# Patient Record
Sex: Male | Born: 1955 | Race: White | Hispanic: No | Smoking: Former smoker
Health system: Southern US, Community
[De-identification: ages and names within clinical notes are randomized; demographics above are authoritative.]

## PROBLEM LIST (undated history)

## (undated) DIAGNOSIS — I739 Peripheral vascular disease, unspecified: Secondary | ICD-10-CM

## (undated) DIAGNOSIS — E785 Hyperlipidemia, unspecified: Secondary | ICD-10-CM

## (undated) DIAGNOSIS — I10 Essential (primary) hypertension: Secondary | ICD-10-CM

## (undated) DIAGNOSIS — I2510 Atherosclerotic heart disease of native coronary artery without angina pectoris: Secondary | ICD-10-CM

## (undated) DIAGNOSIS — E119 Type 2 diabetes mellitus without complications: Secondary | ICD-10-CM

## (undated) DIAGNOSIS — I071 Rheumatic tricuspid insufficiency: Secondary | ICD-10-CM

## (undated) DIAGNOSIS — I639 Cerebral infarction, unspecified: Secondary | ICD-10-CM

## (undated) DIAGNOSIS — E876 Hypokalemia: Secondary | ICD-10-CM

## (undated) DIAGNOSIS — N1831 Chronic kidney disease, stage 3a: Secondary | ICD-10-CM

## (undated) HISTORY — PX: CARDIAC SURGERY: SHX584

## (undated) HISTORY — DX: Hypokalemia: E87.6

## (undated) HISTORY — DX: Rheumatic tricuspid insufficiency: I07.1

## (undated) HISTORY — DX: Cerebral infarction, unspecified: I63.9

## (undated) HISTORY — DX: Peripheral vascular disease, unspecified: I73.9

## (undated) HISTORY — DX: Chronic kidney disease, stage 3a: N18.31

---

## 2005-07-05 ENCOUNTER — Ambulatory Visit (HOSPITAL_COMMUNITY): Admission: RE | Admit: 2005-07-05 | Discharge: 2005-07-05 | Payer: Self-pay | Admitting: Orthopaedic Surgery

## 2006-05-11 ENCOUNTER — Emergency Department (HOSPITAL_COMMUNITY): Admission: EM | Admit: 2006-05-11 | Discharge: 2006-05-11 | Payer: Self-pay | Admitting: Emergency Medicine

## 2018-11-25 ENCOUNTER — Other Ambulatory Visit: Payer: Self-pay

## 2018-11-25 ENCOUNTER — Encounter (HOSPITAL_COMMUNITY): Payer: Self-pay

## 2018-11-25 ENCOUNTER — Emergency Department (HOSPITAL_COMMUNITY)
Admission: EM | Admit: 2018-11-25 | Discharge: 2018-11-25 | Disposition: A | Discharge status: Home / Self Care | Payer: Medicare Other | Attending: Emergency Medicine | Admitting: Emergency Medicine

## 2018-11-25 DIAGNOSIS — Z87891 Personal history of nicotine dependence: Secondary | ICD-10-CM | POA: Diagnosis not present

## 2018-11-25 DIAGNOSIS — Z7984 Long term (current) use of oral hypoglycemic drugs: Secondary | ICD-10-CM | POA: Insufficient documentation

## 2018-11-25 DIAGNOSIS — Z79899 Other long term (current) drug therapy: Secondary | ICD-10-CM | POA: Diagnosis not present

## 2018-11-25 DIAGNOSIS — Z7902 Long term (current) use of antithrombotics/antiplatelets: Secondary | ICD-10-CM | POA: Diagnosis not present

## 2018-11-25 DIAGNOSIS — I10 Essential (primary) hypertension: Secondary | ICD-10-CM | POA: Diagnosis not present

## 2018-11-25 DIAGNOSIS — E119 Type 2 diabetes mellitus without complications: Secondary | ICD-10-CM | POA: Insufficient documentation

## 2018-11-25 DIAGNOSIS — R42 Dizziness and giddiness: Secondary | ICD-10-CM | POA: Diagnosis not present

## 2018-11-25 HISTORY — DX: Atherosclerotic heart disease of native coronary artery without angina pectoris: I25.10

## 2018-11-25 HISTORY — DX: Hyperlipidemia, unspecified: E78.5

## 2018-11-25 HISTORY — DX: Essential (primary) hypertension: I10

## 2018-11-25 HISTORY — DX: Type 2 diabetes mellitus without complications: E11.9

## 2018-11-25 LAB — COMPREHENSIVE METABOLIC PANEL
ALT: 26 U/L (ref 0–44)
AST: 17 U/L (ref 15–41)
Albumin: 3.8 g/dL (ref 3.5–5.0)
Alkaline Phosphatase: 54 U/L (ref 38–126)
Anion gap: 9 (ref 5–15)
BUN: 19 mg/dL (ref 8–23)
CO2: 27 mmol/L (ref 22–32)
Calcium: 8.9 mg/dL (ref 8.9–10.3)
Chloride: 104 mmol/L (ref 98–111)
Creatinine, Ser: 1.36 mg/dL — ABNORMAL HIGH (ref 0.61–1.24)
GFR calc Af Amer: >60 mL/min (ref >60)
GFR calc non Af Amer: 55 mL/min — ABNORMAL LOW (ref >60)
Glucose, Bld: 138 mg/dL — ABNORMAL HIGH (ref 70–99)
Potassium: 3.8 mmol/L (ref 3.5–5.1)
Sodium: 140 mmol/L (ref 135–145)
Total Bilirubin: 0.7 mg/dL (ref 0.3–1.2)
Total Protein: 6.9 g/dL (ref 6.5–8.1)

## 2018-11-25 LAB — CBC WITH DIFFERENTIAL/PLATELET
Abs Immature Granulocytes: 0.05 10*3/uL (ref 0.00–0.07)
Basophils Absolute: 0.1 10*3/uL (ref 0.0–0.1)
Basophils Relative: 1 %
Eosinophils Absolute: 0.2 10*3/uL (ref 0.0–0.5)
Eosinophils Relative: 2 %
HCT: 45.4 % (ref 39.0–52.0)
Hemoglobin: 15 g/dL (ref 13.0–17.0)
Immature Granulocytes: 1 %
Lymphocytes Relative: 18 %
Lymphs Abs: 1.7 10*3/uL (ref 0.7–4.0)
MCH: 30.7 pg (ref 26.0–34.0)
MCHC: 33 g/dL (ref 30.0–36.0)
MCV: 92.8 fL (ref 80.0–100.0)
Monocytes Absolute: 0.6 10*3/uL (ref 0.1–1.0)
Monocytes Relative: 6 %
Neutro Abs: 7 10*3/uL (ref 1.7–7.7)
Neutrophils Relative %: 72 %
Platelets: 197 10*3/uL (ref 150–400)
RBC: 4.89 MIL/uL (ref 4.22–5.81)
RDW: 13.6 % (ref 11.5–15.5)
WBC: 9.6 10*3/uL (ref 4.0–10.5)
nRBC: 0 % (ref 0.0–0.2)

## 2018-11-25 MED ORDER — HYDRALAZINE HCL 10 MG PO TABS
10.0000 mg | ORAL_TABLET | Freq: Once | ORAL | Status: AC
  Administered 2018-11-25: 10 mg via ORAL
  Filled 2018-11-25: qty 1

## 2018-11-25 MED ORDER — MECLIZINE HCL 12.5 MG PO TABS
25.0000 mg | ORAL_TABLET | Freq: Once | ORAL | Status: AC
  Administered 2018-11-25: 18:00:00 25 mg via ORAL
  Filled 2018-11-25: qty 2

## 2018-11-25 MED ORDER — AMLODIPINE BESYLATE 5 MG PO TABS: 5.0000 mg | ORAL_TABLET | Freq: Every day | ORAL | 0 refills | Status: DC

## 2018-11-25 MED ORDER — MECLIZINE HCL 25 MG PO TABS: 25.0000 mg | ORAL_TABLET | Freq: Three times a day (TID) | ORAL | 0 refills | Status: DC | PRN

## 2018-11-25 NOTE — Discharge Instructions (Signed)
Starting tomorrow, take the additional blood pressure medication as prescribed.  Call your primary care provider tomorrow for close follow-up on your visit today. Limit your salt intake in your diet, as this contribute to high blood pressure. You can take meclizine every 8 hours as needed for dizziness. If you develop headache, vision changes, chest pain, or new or worsening symptoms, please return to the emergency department.

## 2018-11-25 NOTE — ED Triage Notes (Signed)
Pt reports he has been checking his at home for the past the days it has been 201/106. Pt reports he has vertigo and gets dizzy when he looks down becomes dizzy. Pt reports doubling up metoprolol . Reports BP has been up for 6 days

## 2018-11-25 NOTE — ED Provider Notes (Signed)
[facility]Y [facility]PARTMENT Provider Note   CSN: [id number] Arrival date & time: 11/25/18  1550    History   Chief Complaint Chief Complaint  Patient presents with  . Hypertension    HPI Kyle Marks is a 63 y.o. male history of hypertension, type 2 diabetes, CAD, hyperlipidemia, presenting to the emergency department with complaint of 1 week of intermittent room-spinning dizziness.  He states his dizziness is positional, worse when he looks down, gets up too quickly, or turns his head to the side too quickly.  He feels better when he is at rest.  The dizziness does cause some nausea.  This is been causing a decreased appetite.  He states he did not have a functioning blood pressure cuff, however got a new one a few days ago and noted his blood pressure was elevated, in the 200s systolic.  He states he began taking double of his metoprolol, however he is unsure of the dosing.  He denies associated headache, vision changes, chest pain, shortness of breath, abdominal pain, neck pain, or any other complaints. 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     The history is provided by the patient.    Past Medical History:  Diagnosis Date  . Coronary artery disease    pt reports he has stents  . Diabetes mellitus without complication (HCC)   . Hyperlipidemia   . Hypertension     There are no active problems to display for this patient.   Past Surgical History:  Procedure Laterality Date  . CARDIAC SURGERY          Home Medications    Prior to Admission medications   Medication Sig Start Date End Date Taking? Authorizing Provider  atorvastatin (LIPITOR) 40 MG tablet Take 40 mg by mouth every evening. 10/21/18  Yes [provider]  clopidogrel (PLAVIX) 75 MG tablet Take 75 mg by mouth at bedtime.  10/21/18  Yes [provider]  glimepiride (AMARYL) 4 MG tablet Take 4 mg by mouth  at bedtime.  10/21/18  Yes [provider]  metFORMIN (GLUCOPHAGE-XR) 500 MG 24 hr tablet Take 1,000 mg by mouth 2 (two) times daily. 10/23/18  Yes [provider]  metoprolol tartrate (LOPRESSOR) 50 MG tablet Take 50 mg by mouth 2 (two) times daily. 10/21/18  Yes [provider]  amLODipine (NORVASC) 5 MG tablet Take 1 tablet (5 mg total) by mouth daily. 11/25/18   Zarria Towell, [place] N, PA-C  meclizine (ANTIVERT) 25 MG tablet Take 1 tablet (25 mg total) by mouth 3 (three) times daily as needed for dizziness. 11/25/18   Kayleen Alig, [place] N, PA-C    Family History No family history on file.  Social History Social History   Tobacco Use  . Smoking status: Former Smoker    Quit date: 11/24/2017    Years since quitting: 1.0  Substance Use Topics  . Alcohol use: Not Currently  . Drug use: Never     Allergies   Patient has no known allergies.   Review of Systems Review of Systems  Eyes: Negative for photophobia and visual disturbance.  Respiratory: Negative for shortness of breath.   Cardiovascular: Negative for chest pain and leg swelling.  Gastrointestinal: Positive for nausea.  Musculoskeletal: Negative for back pain and neck pain.  Neurological: Positive for dizziness. Negative for syncope, speech difficulty, weakness, light-headedness, numbness and headaches.  All other systems reviewed and  are negative.    Physical Exam Updated Vital Signs BP (!) 201/94 (BP Location: Right Arm)   Pulse 60   Temp 98.2 F (36.8 C) (Oral)   Resp 18   Ht 5\' 11"  (1.803 m)   Wt 113.4 kg   SpO2 97%   BMI 34.87 kg/m   Physical Exam Vitals signs and nursing note reviewed.  Constitutional:      General: He is not in acute distress.    Appearance: He is well-developed. He is obese. He is not ill-appearing.  HENT:     Head: Normocephalic and atraumatic.  Eyes:     Conjunctiva/sclera: Conjunctivae normal.  Cardiovascular:     Rate and Rhythm: Normal rate and regular  rhythm.  Pulmonary:     Effort: Pulmonary effort is normal. No respiratory distress.     Breath sounds: Normal breath sounds.  Abdominal:     General: Bowel sounds are normal.     Palpations: Abdomen is soft.     Tenderness: There is no abdominal tenderness. There is no guarding or rebound.  Musculoskeletal:     Right lower leg: No edema.     Left lower leg: No edema.  Skin:    General: Skin is warm.  Neurological:     Mental Status: He is alert.     Comments: Mental Status:  Alert, oriented, thought content appropriate, able to give a coherent history. Speech fluent without evidence of aphasia. Able to follow 2 step commands without difficulty.  Cranial Nerves:  II:  Peripheral visual fields grossly normal, pupils equal, round, reactive to light III,IV, VI: ptosis not present, extra-ocular motions intact bilaterally  V,VII: smile symmetric, facial light touch sensation equal VIII: hearing grossly normal to voice  X: uvula elevates symmetrically  XI: bilateral shoulder shrug symmetric and strong XII: midline tongue extension without fassiculations Motor:  Normal tone. 5/5 in upper and lower extremities bilaterally including strong and equal grip strength and dorsiflexion/plantar flexion Sensory: grossly normal in all extremities.  Cerebellar: normal finger-to-nose with bilateral upper extremities. No pronator drift. Normal heel-to-shin BLE Gait: normal gait and balance CV: distal pulses palpable throughout    Psychiatric:        Mood and Affect: Mood normal.        Behavior: Behavior normal.      ED Treatments / Results  Labs (all labs ordered are listed, but only abnormal results are displayed) Labs Reviewed  COMPREHENSIVE METABOLIC PANEL - Abnormal; Notable for the following components:      Result Value   Glucose, Bld 138 (*)    Creatinine, Ser 1.36 (*)    GFR calc non Af Amer 55 (*)    All other components within normal limits  CBC WITH DIFFERENTIAL/PLATELET     EKG None  Radiology No results found.  Procedures Procedures (including critical care time)  Medications Ordered in ED Medications  hydrALAZINE (APRESOLINE) tablet 10 mg (10 mg Oral Given 11/25/18 1739)  meclizine (ANTIVERT) tablet 25 mg (25 mg Oral Given 11/25/18 1739)     Initial Impression / Assessment and Plan / ED Course  I have reviewed the triage vital signs and the nursing notes.  Pertinent labs & imaging results that were available during my care of the patient were reviewed by me and considered in my medical decision making (see chart for details).        Pt presenting with hypertension and symptoms consistent with prior episodes of peripheral vertigo. Symptoms of dizziness are positional and  improved with rest. He reports symptoms are just like prior episodes of vertigo. No headache, vision changes, or other assoc symptoms. He takes an unknown dose of metoprolol daily, states his BP normally runs around 160 systolic. Normal neuro exam today. Pt discussed with and evaluated by Dr. Juleen ChinaKohut, who recommends treat BP and vertigo, does not believe stroke work up is indicated due to presentation of symptoms and similar hx of vertigo. Pt agreeable with this plan. Treated with meclizine and hydralazine. Will monitor and re-evaluate.  Patient reports significant improvement in symptoms after meclizine.  Blood pressure with slight improvement after hydralazine, though still remains elevated.  This was discussed with Dr. Juleen ChinaKohut, who recommends patient is safe for discharge.  He has close outpatient follow-up, instructed he call tomorrow.  Will start him on low-dose Norvasc to take in addition to metoprolol for better blood pressure management.  He is instructed to return to the ED should any symptoms change or worsen in any way.  He seems reliable for follow-up.  Patient is agreeable to plan and safe for discharge.  Discussed results, findings, treatment and follow up. Patient advised of  return precautions. Patient verbalized understanding and agreed with plan.  Final Clinical Impressions(s) / ED Diagnoses   Final diagnoses:  Hypertension, unspecified type  Vertigo    ED Discharge Orders         Ordered    amLODipine (NORVASC) 5 MG tablet  Daily     11/25/18 1937    meclizine (ANTIVERT) 25 MG tablet  3 times daily PRN     11/25/18 1939           Jacqueline Delapena, [place] N, PA-C 11/25/18 2122    Raeford RazorKohut, Stephen, MD 11/26/18 [id number]

## 2018-11-25 NOTE — ED Notes (Signed)
Pt reports he smoked to over 40 years   Stopped last year

## 2018-12-18 ENCOUNTER — Encounter

## 2018-12-18 ENCOUNTER — Ambulatory Visit (INDEPENDENT_AMBULATORY_CARE_PROVIDER_SITE_OTHER): Payer: Medicare Other | Admitting: Internal Medicine

## 2018-12-18 ENCOUNTER — Other Ambulatory Visit: Payer: Self-pay

## 2018-12-18 VITALS — BP 157/82 | HR 89 | Temp 95.9°F | Ht 71.0 in | Wt 257.2 lb

## 2018-12-18 DIAGNOSIS — R634 Abnormal weight loss: Secondary | ICD-10-CM

## 2018-12-18 MED ORDER — ATORVASTATIN CALCIUM 80 MG PO TABS: 80.0000 mg | ORAL_TABLET | Freq: Every day | ORAL | 3 refills | Status: DC

## 2018-12-18 NOTE — Progress Notes (Signed)
Cardiology Office Note   Date:  12/18/2018   ID:  Kyle Marks, DOB 08-28-1955, MRN [id number]  PCP:  Doristine Section, MD  Cardiologist:   Dorris Carnes, MD   F/U of HTN    History of Present Illness: Kyle Marks is a 63 y.o. male with a history of HTN, DM, PVOD (s/p iliac stent), HL    He was seen at [facility] ED on 11/25/18 with dizziness.   Pt reports positional, worse with looking down, turnnig head too fast, change in position.   Assoc  With nausea  BP has been high at that time  In ED symptoms improved with meclizine  Again, BP was noted to be very high  He was given Rx for  amlodipine and told to continue metoprolol  [facility]ce ED, he has not started amlodipine   He continues on metoprolol Dizziness is improved  Denies CP     Review of records pt reported on losartan  He admits to missing    Current Meds  Medication Sig  . atorvastatin (LIPITOR) 40 MG tablet Take 40 mg by mouth every evening.  . clopidogrel (PLAVIX) 75 MG tablet Take 75 mg by mouth at bedtime.   Marland Kitchen glimepiride (AMARYL) 4 MG tablet Take 4 mg by mouth at bedtime.   . metFORMIN (GLUCOPHAGE-XR) 500 MG 24 hr tablet Take 1,000 mg by mouth 2 (two) times daily.  . metoprolol tartrate (LOPRESSOR) 50 MG tablet Take 50 mg by mouth 2 (two) times daily.     Allergies:   Patient has no known allergies.   Past Medical History:  Diagnosis Date  . Coronary artery disease    pt reports he has stents  . Diabetes mellitus without complication ([place])   . Hyperlipidemia   . Hypertension     Past Surgical History:  Procedure Laterality Date  . CARDIAC SURGERY       Social History:  The patient  reports that he quit smoking about 12 months ago. He does not have any smokeless tobacco history on file. He reports previous alcohol use. He reports that he does not use drugs.   Family History:  The patient's family history is not on file.    ROS:  Please see the history of present illness. All other systems are  reviewed and  Negative to the above problem except as noted.    PHYSICAL EXAM: VS:  BP (!) 157/82   Pulse 89   Temp (!) 95.9 F (35.5 C) (Temporal)   Ht 5\' 11"  (1.803 m)   Wt 257 lb 3.2 oz (116.7 kg)   SpO2 98%   BMI 35.87 kg/m   GEN: Obese 63 yo  in no acute distress  HEENT: normal  Neck: no JVD, carotid bruits, or masses Cardiac: RRR; no murmurs, rubs, or gallops,no edema  Respiratory:  clear to auscultation bilaterally, normal work of breathing GI: soft, nontender, nondistended, + BS  No hepatomegaly  MS: no deformity Moving all extremities   Skin: warm and dry, no rash Neuro:  Strength and sensation are intact Psych: euthymic mood, full affect   EKG:  EKG is not ordered today.  On 11/25/18:  SR 64 bpm   Nonspecific ST changes    Lipid Panel No results found for: CHOL, TRIG, HDL, CHOLHDL, VLDL, LDLCALC, LDLDIRECT    Wt Readings from Last 3 Encounters:  12/18/18 257 lb 3.2 oz (116.7 kg)  11/25/18 250 lb (113.4 kg)      ASSESSMENT AND  PLAN:  1  HTN  BP is better than in ED   Still not optimized    HE is followed in [facility] system  Seen on 9/3 by Dr Annabelle HarmanSmith   Looks like he had been on Hyzaar but not taking   Neeed to confirm what he has   By review of last not meds rewritten for.    2  Dizziness  Improved      3  HL   On lipitor reportedly  Labs on 12/17/18:   LDL 93  HDL 30   Should be lower.   Compliance may be an issue  4  Hx PVOD   S/p iliac staent  On plavix  5  Hx DM  REcords show A1C is up to 10   Last was 7      Stressed importance of taking meds     Given that he is established with a provider through the [facility] system who is addressing above problems, I would not recomm f/u as initially planned     Signed, Dietrich PatesPaula Geovanie Winnett, MD  12/18/2018 1:52 PM    Kyle Marks [address], [place], [place]  [address] Phone: [phone]; Fax: [phone]

## 2018-12-18 NOTE — Patient Instructions (Signed)
Medication Instructions:  Your physician recommends that you continue on your current medications as directed. Please refer to the Current Medication list given to you today.  If you need a refill on your cardiac medications before your next appointment, please call your pharmacy.   Lab work: NONE   If you have labs (blood work) drawn today and your tests are completely normal, you will receive your results only by: . MyChart Message (if you have MyChart) OR . A paper copy in the mail If you have any lab test that is abnormal or we need to change your treatment, we will call you to review the results.  Testing/Procedures: NONE   Follow-Up: At [facility], you and your health needs are our priority.  As part of our continuing mission to provide you with exceptional heart care, we have created designated Provider Care Teams.  These Care Teams include your primary Cardiologist (physician) and Advanced Practice Providers (APPs -  Physician Assistants and Nurse Practitioners) who all work together to provide you with the care you need, when you need it. You will need a follow up appointment in 6 months.  Please call our office 2 months in advance to schedule this appointment.  You may see No primary care provider on file. or one of the following Advanced Practice Providers on your designated Care Team:   Brittany Strader, PA-C ([place] Office) . Michele Lenze, PA-C ([place] Office)  Any Other Special Instructions Will Be Listed Below (If Applicable). Thank you for choosing [facility]!     

## 2019-01-20 ENCOUNTER — Encounter: Payer: Medicare Other | Attending: Internal Medicine | Admitting: Nutrition

## 2019-01-20 VITALS — Ht 71.0 in | Wt 259.0 lb

## 2019-01-20 DIAGNOSIS — IMO0002 Reserved for concepts with insufficient information to code with codable children: Secondary | ICD-10-CM

## 2019-01-20 DIAGNOSIS — E1165 Type 2 diabetes mellitus with hyperglycemia: Secondary | ICD-10-CM | POA: Insufficient documentation

## 2019-01-20 DIAGNOSIS — E118 Type 2 diabetes mellitus with unspecified complications: Secondary | ICD-10-CM | POA: Insufficient documentation

## 2019-01-20 NOTE — Progress Notes (Signed)
Telephone visit. Medical Nutrition Therapy:  Appt start time: 5638 end time:  7564.  Assessment:  Primary concerns today:  Diabetes x 5 yrs. LIves with his wife. Wife does most of the shopping and cooking. Eats 2 meals and snacks.Marland Kitchen Referred by his cardiologist. He wants to lose weight. Metformin XR 500 mg BID, Glimepiride 4 mg a day Thinks last A1C was about  7.1 -7.2% He admits to being a snacker. Noted elevated Creatine level of 1.36 mg/dl. CMP Latest Ref Rng & Units 11/25/2018  Glucose 70 - 99 mg/dL 138(H)  BUN 8 - 23 mg/dL 19  Creatinine 0.61 - 1.24 mg/dL 1.36(H)  Sodium 135 - 145 mmol/L 140  Potassium 3.5 - 5.1 mmol/L 3.8  Chloride 98 - 111 mmol/L 104  CO2 22 - 32 mmol/L 27  Calcium 8.9 - 10.3 mg/dL 8.9  Total Protein 6.5 - 8.1 g/dL 6.9  Total Bilirubin 0.3 - 1.2 mg/dL 0.7  Alkaline Phos 38 - 126 U/L 54  AST 15 - 41 U/L 17  ALT 0 - 44 U/L 26   Lipid Panel     Preferred Learning Style:     No preference indicated   Learning Readiness:   Ready  Change in progress   MEDICATIONS:   DIETARY INTAKE:  24-hr recall:  B ( AM): Slim fast  Snk ( AM):   L ( PM): [place]  beef and potatoes, water  Snk ( PM): D ( PM):  6 chicken finger, FF and [place] toast.water Snk ( PM): Beverages:  Usual physical activity:  Walks some  Estimated energy needs: 1600  calories 180 g carbohydrates 120 g protein 44 g fat  Progress Towards Goal(s):  In progress.   Nutritional Diagnosis:  NB-1.1 Food and nutrition-related knowledge deficit As related to diabetes and Obesity  As evidenced by BMI 46 and A1C 7.1%.    Intervention:  Nutrition and Diabetes education provided on My Plate, CHO counting, meal planning, portion sizes, timing of meals, avoiding snacks between meals unless having a low blood sugar, target ranges for A1C and blood sugars, signs/symptoms and treatment of hyper/hypoglycemia, monitoring blood sugars, taking medications as prescribed, benefits of exercising 30  minutes per day and prevention of complications of DM.  Goals  Follow My Plate  Eat three meals per day Cut out processed foods and foods high in salt. Cut out snacks Walk 15 minutes BID. Get FBS less than 130 mg/dl. Lose 1 lb per week Get A1C to 6.5% or less.  Teaching Method Utilized:  Visual Auditory Hands on  Handouts given during visit include:  The Plate Method   Meal Plan  Diabetes Instructions     Barriers to learning/adherence to lifestyle change: none  Demonstrated degree of understanding via:  Teach Back   Monitoring/Evaluation:  Dietary intake, exercise, , and body weight in 1 month(s).

## 2019-01-21 ENCOUNTER — Encounter: Payer: Self-pay | Admitting: Nutrition

## 2019-01-21 NOTE — Patient Instructions (Signed)
  Goals  Follow My Plate  Eat three meals per day Cut out processed foods and foods high in salt. Cut out snacks Walk 15 minutes BID. Get FBS less than 130 mg/dl. Lose 1 lb per week Get A1C to 6.5% or less

## 2019-02-15 ENCOUNTER — Other Ambulatory Visit: Payer: Self-pay

## 2019-02-24 ENCOUNTER — Encounter: Payer: Self-pay | Admitting: Nutrition

## 2019-02-24 ENCOUNTER — Other Ambulatory Visit: Payer: Self-pay

## 2019-02-24 ENCOUNTER — Encounter: Payer: Medicare Other | Attending: Family Medicine | Admitting: Nutrition

## 2019-02-24 DIAGNOSIS — E118 Type 2 diabetes mellitus with unspecified complications: Secondary | ICD-10-CM | POA: Diagnosis present

## 2019-02-24 DIAGNOSIS — E1165 Type 2 diabetes mellitus with hyperglycemia: Secondary | ICD-10-CM | POA: Diagnosis present

## 2019-02-24 DIAGNOSIS — IMO0002 Reserved for concepts with insufficient information to code with codable children: Secondary | ICD-10-CM

## 2019-02-24 NOTE — Patient Instructions (Signed)
Goals  Follow My Plate  Eat three meals per day Increase lower carb vegetables at night. Continue to cut out processed foods and foods high in salt Cut out snacks unless veggies. Walk 30-45  minutes BID. Get FBS less than 130 mg/dl. Lose 1 lb per week-5 lbs per month. Get A1C to 6.5% or less. Drink more water

## 2019-02-24 NOTE — Progress Notes (Signed)
Telephone visit. Medical Nutrition Therapy:  Appt start time: 3151 end time:  1115  Assessment:  Primary concerns today:  Diabetes x 5 yrs. LIves with his wife. Wife does most of the shopping and cooking."I need some meal plans to follow." He notes he is trying to cut out snacking. Has been walking a little but knows he needs to do more. Hasn'Metformin XR 500 mg BID, Glimepiride 4 mg a dayt been testing blood sugars. No weight loss. Admits to eating later at night. Likes to snack late at night.  HIs wife works second shift and he snacks while home alone. A1C -6.9% 12/2018  Noted elevated Creatine level of 1.36 mg/dl.  CMP Latest Ref Rng & Units 11/25/2018  Glucose 70 - 99 mg/dL 138(H)  BUN 8 - 23 mg/dL 19  Creatinine 0.61 - 1.24 mg/dL 1.36(H)  Sodium 135 - 145 mmol/L 140  Potassium 3.5 - 5.1 mmol/L 3.8  Chloride 98 - 111 mmol/L 104  CO2 22 - 32 mmol/L 27  Calcium 8.9 - 10.3 mg/dL 8.9  Total Protein 6.5 - 8.1 g/dL 6.9  Total Bilirubin 0.3 - 1.2 mg/dL 0.7  Alkaline Phos 38 - 126 U/L 54  AST 15 - 41 U/L 17  ALT 0 - 44 U/L 26   Lipid Panel     Preferred Learning Style:     No preference indicated   Learning Readiness:   Ready  Change in progress   MEDICATIONS:   DIETARY INTAKE:  24-hr recall:  B ( AM): Slim fast  Snk ( AM):   L ( PM): [place]  beef and potatoes, water  Snk ( PM): D ( PM):  6 chicken finger, FF and [place] toast.water Snk ( PM): Beverages:  Usual physical activity:  Walks some  Estimated energy needs: 1600  calories 180 g carbohydrates 120 g protein 44 g fat  Progress Towards Goal(s):  In progress.   Nutritional Diagnosis:  NB-1.1 Food and nutrition-related knowledge deficit As related to diabetes and Obesity  As evidenced by BMI 46 and A1C 7.1%.    Intervention:  Nutrition and Diabetes education provided on My Plate, CHO counting, meal planning, portion sizes, timing of meals, avoiding snacks between meals unless having a low blood sugar,  target ranges for A1C and blood sugars, signs/symptoms and treatment of hyper/hypoglycemia, monitoring blood sugars, taking medications as prescribed, benefits of exercising 30 minutes per day and prevention of complications of DM.  Goals Test blood sugars twice a day  Follow My Plate  Eat three meals per day Increase lower carb vegetables at night. Cut out processed foods and foods high in salt.-making progress. Cut out snacks unless veggies. Walk 30-45  minutes BID. Get FBS less than 130 mg/dl. Lose 1 lb per week-5 lbs per month. Get A1C to 6.5% or less.  Teaching Method Utilized:  Visual Auditory Hands on  Handouts given during visit include:  The Plate Method   Meal Plan  Diabetes Instructions     Barriers to learning/adherence to lifestyle change: none  Demonstrated degree of understanding via:  Teach Back   Monitoring/Evaluation:  Dietary intake, exercise, , and body weight in 3 month(s).

## 2019-04-13 ENCOUNTER — Other Ambulatory Visit: Payer: Self-pay

## 2019-04-13 ENCOUNTER — Encounter (HOSPITAL_COMMUNITY): Payer: Self-pay | Admitting: Emergency Medicine

## 2019-04-13 ENCOUNTER — Emergency Department (HOSPITAL_COMMUNITY)
Admission: EM | Admit: 2019-04-13 | Discharge: 2019-04-13 | Disposition: A | Discharge status: Home / Self Care | Payer: Medicare Other | Attending: Emergency Medicine | Admitting: Emergency Medicine

## 2019-04-13 DIAGNOSIS — E119 Type 2 diabetes mellitus without complications: Secondary | ICD-10-CM | POA: Diagnosis not present

## 2019-04-13 DIAGNOSIS — I10 Essential (primary) hypertension: Secondary | ICD-10-CM | POA: Diagnosis not present

## 2019-04-13 DIAGNOSIS — Z7984 Long term (current) use of oral hypoglycemic drugs: Secondary | ICD-10-CM | POA: Insufficient documentation

## 2019-04-13 DIAGNOSIS — Z79899 Other long term (current) drug therapy: Secondary | ICD-10-CM | POA: Diagnosis not present

## 2019-04-13 DIAGNOSIS — I2510 Atherosclerotic heart disease of native coronary artery without angina pectoris: Secondary | ICD-10-CM | POA: Diagnosis not present

## 2019-04-13 DIAGNOSIS — Z7901 Long term (current) use of anticoagulants: Secondary | ICD-10-CM | POA: Insufficient documentation

## 2019-04-13 DIAGNOSIS — Z87891 Personal history of nicotine dependence: Secondary | ICD-10-CM | POA: Insufficient documentation

## 2019-04-13 DIAGNOSIS — H538 Other visual disturbances: Secondary | ICD-10-CM | POA: Diagnosis present

## 2019-04-13 LAB — CBG MONITORING, ED: Glucose-Capillary: 295 mg/dL — ABNORMAL HIGH (ref 70–99)

## 2019-04-13 MED ORDER — LOSARTAN POTASSIUM 25 MG PO TABS
12.5000 mg | ORAL_TABLET | Freq: Once | ORAL | Status: DC
  Filled 2019-04-13: qty 1

## 2019-04-13 MED ORDER — LOSARTAN POTASSIUM-HCTZ 100-12.5 MG PO TABS: 1.0000 | ORAL_TABLET | Freq: Every day | ORAL | 0 refills | Status: DC

## 2019-04-13 MED ORDER — LOSARTAN POTASSIUM 25 MG PO TABS
25.0000 mg | ORAL_TABLET | Freq: Once | ORAL | Status: AC
  Administered 2019-04-13: 25 mg via ORAL

## 2019-04-13 NOTE — ED Notes (Signed)
Patient now states that he took his medication today but had been out for a week until today.

## 2019-04-13 NOTE — ED Notes (Signed)
Patient's blood pressure still elevated. Patient given blood pressure medication in triage.

## 2019-04-13 NOTE — ED Triage Notes (Signed)
Patient states hypertension x 1 week. Patient denies any blurred vision, weakness, or headache. Patient' current blood pressure id 187/94 in triage. Patient states that he has been out of his Losartan x 1 week. Patient is on metoprolol and has been currently taking that medication.

## 2019-04-13 NOTE — Discharge Instructions (Signed)
Please take your home blood pressure medicines, your blood pressure here has improved - and will continue to improve as you take your medicines.  ER for worsening symptoms - see the attached phone number for the cardiology office in [place].

## 2019-04-13 NOTE — ED Notes (Signed)
Pt reports he has been out of his losartan for a week, pt asks can he go home now, (after this nurse gives BP medication)- informed pt that he should wait and see the Dr, if he decided to leave it would be leaving Against Medical advice.

## 2019-04-13 NOTE — ED Provider Notes (Signed)
[facility] Provider Note   CSN: [id number] Arrival date & time: 04/13/19  1743     History Chief Complaint  Patient presents with  . Hypertension    Kyle Marks is a 63 y.o. male.  HPI   This patient is a pleasant 63 year old male with a known history of diabetes, hypertension, hyperlipidemia and coronary disease with stents.  He has been off of his blood pressure medication, has not been able to get it refilled, states that he went to the pharmacy today and was able to pick up 4 tablets but because they did not have enough they told him to come back tomorrow.  He then went to get coronavirus testing at a urgent care because he had had some coughing and diarrhea.  He tested negative but they found his blood pressure to be severely elevated and told him to come to the hospital.  He has no other symptoms including chest pain, shortness of breath, abdominal pain, weakness, numbness, tingling, headache, difficulty with walking or coordination or speech.  Everything else seems to be doing just fine and he has no significant complaints.  He was surprised to see his blood pressure as high as it was measuring over 200 systolic.  He has not yet taken any of the pills he was given at the pharmacy today Past Medical History:  Diagnosis Date  . Coronary artery disease    pt reports he has stents  . Diabetes mellitus without complication (HCC)   . Hyperlipidemia   . Hypertension     There are no problems to display for this patient.   Past Surgical History:  Procedure Laterality Date  . CARDIAC SURGERY         History reviewed. No pertinent family history.  Social History   Tobacco Use  . Smoking status: Former Smoker    Quit date: 11/24/2017    Years since quitting: 1.3  . Smokeless tobacco: Never Used  Substance Use Topics  . Alcohol use: Not Currently  . Drug use: Never    Home Medications Prior to Admission medications   Medication Sig Start  Date End Date Taking? Authorizing Provider  atorvastatin (LIPITOR) 80 MG tablet Take 1 tablet (80 mg total) by mouth daily. 12/18/18 03/18/19  Pricilla Riffleoss, Paula V, MD  clopidogrel (PLAVIX) 75 MG tablet Take 75 mg by mouth at bedtime.  10/21/18   [provider]  glimepiride (AMARYL) 4 MG tablet Take 4 mg by mouth at bedtime.  10/21/18   [provider]  losartan-hydrochlorothiazide (HYZAAR) 100-12.5 MG tablet Take 1 tablet by mouth daily.    [provider]  losartan-hydrochlorothiazide (HYZAAR) 100-12.5 MG tablet Take 1 tablet by mouth daily. 04/13/19   Eber HongMiller, Cal Gindlesperger, MD  metFORMIN (GLUCOPHAGE) 500 MG tablet Take 500 mg by mouth.    [provider]  metFORMIN (GLUCOPHAGE-XR) 500 MG 24 hr tablet Take 1,000 mg by mouth 2 (two) times daily. 10/23/18   [provider]  metoprolol tartrate (LOPRESSOR) 50 MG tablet Take 50 mg by mouth 2 (two) times daily. 10/21/18   [provider]    Allergies    Patient has no known allergies.  Review of Systems   Review of Systems  All other systems reviewed and are negative.   Physical Exam Updated Vital Signs BP (!) 191/83   Pulse 65   Temp 98.8 F (37.1 C) (Oral)   Resp 18   Ht 1.803 m (5\' 11" )   Wt 116.1 kg  SpO2 94%   BMI 35.70 kg/m   Physical Exam Vitals and nursing note reviewed.  Constitutional:      General: He is not in acute distress.    Appearance: He is well-developed.  HENT:     Head: Normocephalic and atraumatic.     Mouth/Throat:     Pharynx: No oropharyngeal exudate.  Eyes:     General: No scleral icterus.       Right eye: No discharge.        Left eye: No discharge.     Conjunctiva/sclera: Conjunctivae normal.     Pupils: Pupils are equal, round, and reactive to light.  Neck:     Thyroid: No thyromegaly.     Vascular: No JVD.  Cardiovascular:     Rate and Rhythm: Normal rate and regular rhythm.     Heart sounds: Normal heart sounds. No murmur. No friction rub. No gallop.     Pulmonary:     Effort: Pulmonary effort is normal. No respiratory distress.     Breath sounds: Normal breath sounds. No wheezing or rales.  Abdominal:     General: Bowel sounds are normal. There is no distension.     Palpations: Abdomen is soft. There is no mass.     Tenderness: There is no abdominal tenderness.  Musculoskeletal:        General: No tenderness. Normal range of motion.     Cervical back: Normal range of motion and neck supple.  Lymphadenopathy:     Cervical: No cervical adenopathy.  Skin:    General: Skin is warm and dry.     Findings: No erythema or rash.  Neurological:     Mental Status: He is alert.     Coordination: Coordination normal.     Comments: Normal speech gait and coordination, normal strength in all 4 extremities, facial symmetry present  Psychiatric:        Behavior: Behavior normal.     ED Results / Procedures / Treatments   Labs (all labs ordered are listed, but only abnormal results are displayed) Labs Reviewed  CBG MONITORING, ED - Abnormal; Notable for the following components:      Result Value   Glucose-Capillary 295 (*)    All other components within normal limits    EKG EKG Interpretation  Date/Time:  Tuesday April 13 2019 20:13:13 EST Ventricular Rate:  67 PR Interval:  154 QRS Duration: 98 QT Interval:  374 QTC Calculation: 395 R Axis:   64 Text Interpretation: Normal sinus rhythm T wave abnormality, consider lateral ischemia Abnormal ECG sine 11/2018, no changes seen Confirmed by Eber Hong ([id number]) on 04/13/2019 9:52:13 PM   Radiology No results found.  Procedures Procedures (including critical care time)  Medications Ordered in ED Medications  losartan (COZAAR) tablet 25 mg (25 mg Oral Given 04/13/19 2126)    ED Course  I have reviewed the triage vital signs and the nursing notes.  Pertinent labs & imaging results that were available during my care of the patient were reviewed by me and considered in my  medical decision making (see chart for details).    MDM Rules/Calculators/A&P                      The patient is unremarkable in his clinical appearance, his EKG is unremarkable, the patient has no significant findings of concern, his blood pressure is has improved from 210 down to 190 systolic, he has medication at home, I will give  him a written prescription in case he needs to take the prescription somewhere else if that pharmacy does not get enough.  He is agreeable to this plan.  He will also be referred to local cardiology at his request so that he can follow-up and reestablish care with a local cardiologist after his retired. Final Clinical Impression(s) / ED Diagnoses Final diagnoses:  Essential hypertension    Rx / DC Orders ED Discharge Orders         Ordered    losartan-hydrochlorothiazide (HYZAAR) 100-12.5 MG tablet  Daily     04/13/19 2214           Noemi Chapel, MD 04/13/19 2216

## 2019-04-22 ENCOUNTER — Other Ambulatory Visit: Payer: Self-pay

## 2019-04-22 ENCOUNTER — Ambulatory Visit (INDEPENDENT_AMBULATORY_CARE_PROVIDER_SITE_OTHER): Payer: Medicare Other | Admitting: Internal Medicine

## 2019-04-22 ENCOUNTER — Encounter: Payer: Self-pay | Admitting: Internal Medicine

## 2019-04-22 VITALS — BP 171/84 | HR 77 | Temp 97.7°F | Ht 71.0 in | Wt 260.0 lb

## 2019-04-22 DIAGNOSIS — E785 Hyperlipidemia, unspecified: Secondary | ICD-10-CM

## 2019-04-22 DIAGNOSIS — I499 Cardiac arrhythmia, unspecified: Secondary | ICD-10-CM | POA: Diagnosis not present

## 2019-04-22 DIAGNOSIS — I10 Essential (primary) hypertension: Secondary | ICD-10-CM | POA: Diagnosis not present

## 2019-04-22 DIAGNOSIS — E0800 Diabetes mellitus due to underlying condition with hyperosmolarity without nonketotic hyperglycemic-hyperosmolar coma (NKHHC): Secondary | ICD-10-CM

## 2019-04-22 MED ORDER — AMLODIPINE BESYLATE 5 MG PO TABS: 5.0000 mg | ORAL_TABLET | Freq: Every day | ORAL | 3 refills | Status: DC

## 2019-04-22 NOTE — Progress Notes (Signed)
Cardiology Office Note   Date:  04/22/2019   ID:  Kyle Marks, DOB 07-02-1955, MRN [id number]  PCP:  Salome Holmes, MD  Cardiologist:   Dietrich Pates, MD   F/U of HTN    History of Present Illness: Kyle Marks is a 64 y.o. male with a history of HTN, DM, PVOD (s/p iliac stent), HL    He was seen at [facility] ED on 11/25/18 with dizziness.   Pt reports positional, worse with looking down, turnnig head too fast, change in position.   Assoc with nausea.  BP has been high at that time  In ED, symptoms improved with meclizine  Again, BP was noted to be very high  He was given Rx for  amlodipine and told to continue metoprolol  SInce ED, he has not started amlodipine   He continues on metoprolol Dizziness is improved  Denies CP     Review of records pt reported on losartan  He admits to missing    Pt just seen in ED on 12/29   BP again high   Did not take meds   Takes BP at home   HIgher   One time HR was noted to be irregular  Denies palpitations  No dizziness  Trying to work on diet  No CP   No SOB   Feels pretty good   Current Meds  Medication Sig  . atorvastatin (LIPITOR) 80 MG tablet Take 1 tablet (80 mg total) by mouth daily.  . clopidogrel (PLAVIX) 75 MG tablet Take 75 mg by mouth at bedtime.   Marland Kitchen glimepiride (AMARYL) 4 MG tablet Take 4 mg by mouth at bedtime.   Marland Kitchen losartan-hydrochlorothiazide (HYZAAR) 100-12.5 MG tablet Take 1 tablet by mouth daily.  . metFORMIN (GLUCOPHAGE-XR) 500 MG 24 hr tablet Take 1,000 mg by mouth 2 (two) times daily.  . metoprolol tartrate (LOPRESSOR) 50 MG tablet Take 50 mg by mouth 2 (two) times daily.     Allergies:   Patient has no known allergies.   Past Medical History:  Diagnosis Date  . Coronary artery disease    pt reports he has stents  . Diabetes mellitus without complication (HCC)   . Hyperlipidemia   . Hypertension     Past Surgical History:  Procedure Laterality Date  . CARDIAC SURGERY       Social History:  The  patient  reports that he quit smoking about 16 months ago. He has never used smokeless tobacco. He reports previous alcohol use. He reports that he does not use drugs.   Family History:  The patient's family history is not on file.    ROS:  Please see the history of present illness. All other systems are reviewed and  Negative to the above problem except as noted.    PHYSICAL EXAM: VS:  BP (!) 171/84   Pulse 77   Temp 97.7 F (36.5 C)   Ht 5\' 11"  (1.803 m)   Wt 260 lb (117.9 kg)   SpO2 96%   BMI 36.26 kg/m   GEN: Obese 64 yo  in no acute distress  HEENT: normal  Neck: no JVD, carotid bruits, or masses Cardiac: RRR; no murmurs, rubs, or gallops,no edema  Respiratory:  clear to auscultation bilaterally, normal work of breathing GI: soft, nontender, nondistended, + BS  No hepatomegaly  MS: no deformity Moving all extremities   Skin: warm and dry, no rash Neuro:  Strength and sensation are intact Psych: euthymic mood,  full affect   EKG:  EKG is not ordered today.  On 11/25/18:  SR 64 bpm   Nonspecific ST changes    Lipid Panel No results found for: CHOL, TRIG, HDL, CHOLHDL, VLDL, LDLCALC, LDLDIRECT    Wt Readings from Last 3 Encounters:  04/22/19 260 lb (117.9 kg)  04/13/19 256 lb (116.1 kg)  01/20/19 259 lb (117.5 kg)      ASSESSMENT AND PLAN:  1  HTN  BP is still high   Would add amlodipine  5 mg   F/u for BP in 4 to 6 wks  2  Dizziness  Denies     3  Irreg HB   Noticed on BP monitor   WIll set up for 2 wk Zio patch to check for Afib (silent)  3  HL   On lipitor reportedly  Labs on 12/17/18:  Get labs  4  Hx PVOD   S/p iliac staent  On plavix  Denis symptomms    5  Hx DM  REcords show A1C was up to 10   Will recheck Needs tight control       Stressed again importance of taking meds       Given that he is established with a provider through the [place] system who is addressing above problems, I would not recomm f/u as initially planned     Signed, Dorris Carnes, MD  04/22/2019 10:22 [address], [place], [place]  [address] Phone: [phone]; Fax: [phone]

## 2019-04-22 NOTE — Patient Instructions (Signed)
Dr Tenny Craw recommends that to start amlodipine 5 mg daily  Will plan to check labs today    Will be setting you up for an event monitor (patch) given that you noted an irregular heart beat   It will be mailed to you with instructions on how to place   Wear for 2 wks and mail back  Plan for follow up in 4 to 6 wks to recheck blood pressure

## 2019-04-24 LAB — HEMOGLOBIN A1C
Hgb A1c MFr Bld: 7.8 % of total Hgb — ABNORMAL HIGH (ref <5.7)
Mean Plasma Glucose: 177 (calc)
eAG (mmol/L): 9.8 (calc)

## 2019-04-24 LAB — LIPID PANEL
Cholesterol: 139 mg/dL (ref <200)
HDL: 25 mg/dL — ABNORMAL LOW (ref >=40)
LDL Cholesterol (Calc): 89 mg/dL (calc)
Non-HDL Cholesterol (Calc): 114 mg/dL (calc) (ref <130)
Total CHOL/HDL Ratio: 5.6 (calc) — ABNORMAL HIGH (ref <5.0)
Triglycerides: 155 mg/dL — ABNORMAL HIGH (ref <150)

## 2019-04-24 LAB — BASIC METABOLIC PANEL
BUN/Creatinine Ratio: 16 (calc) (ref 6–22)
BUN: 23 mg/dL (ref 7–25)
CO2: 26 mmol/L (ref 20–32)
Calcium: 9 mg/dL (ref 8.6–10.3)
Chloride: 106 mmol/L (ref 98–110)
Creat: 1.43 mg/dL — ABNORMAL HIGH (ref 0.70–1.25)
Glucose, Bld: 180 mg/dL — ABNORMAL HIGH (ref 65–99)
Potassium: 4.2 mmol/L (ref 3.5–5.3)
Sodium: 141 mmol/L (ref 135–146)

## 2019-04-26 ENCOUNTER — Ambulatory Visit: Payer: Medicare Other | Admitting: Nutrition

## 2019-04-28 ENCOUNTER — Telehealth: Payer: Self-pay | Admitting: *Deleted

## 2019-04-28 DIAGNOSIS — E785 Hyperlipidemia, unspecified: Secondary | ICD-10-CM

## 2019-04-28 MED ORDER — ROSUVASTATIN CALCIUM 10 MG PO TABS: 10.0000 mg | ORAL_TABLET | Freq: Every day | ORAL | 3 refills | Status: DC

## 2019-04-28 NOTE — Telephone Encounter (Signed)
-----   Message from Pricilla Riffle, MD sent at 04/26/2019 10:40 AM EST ----- HgbA1 C is better than it has been   Now 7.8   Watch carbs Kidney function is relatively stable LDL is 89  Needs to be less than 70   HDL low 25 REcomm Crestor 10 mg since he has peripheral vascular disease F/U lipids in 2 months with AST

## 2019-04-30 ENCOUNTER — Ambulatory Visit (INDEPENDENT_AMBULATORY_CARE_PROVIDER_SITE_OTHER): Payer: Medicare Other

## 2019-04-30 DIAGNOSIS — I499 Cardiac arrhythmia, unspecified: Secondary | ICD-10-CM | POA: Diagnosis not present

## 2019-05-23 NOTE — Progress Notes (Signed)
Cardiology Office Note   Date:  05/24/2019   ID:  ADREAN HEITZ, DOB 11-11-1955, MRN [id number]  PCP:  Doristine Section, MD  Cardiologist:   Dorris Carnes, MD   F/U of HTN    History of Present Illness: Kyle Marks is a 64 y.o. male with a history of HTN, DM, PVOD (s/p iliac stent), HL    He was seen at [facility] ED on 11/25/18 with dizziness.   Pt reports positional, worse with looking down, turnnig head too fast, change in position.   Assoc with nausea.  BP has been high at that time symptoms improved with meclizine.  Patient also given Rx for amlodipine. When I saw him in the fall his dizziness had resolved     Review of records pt reported on losartan  He admits to missing    Pt just seen in ED on 12/29   BP again high   Did not take meds   I saw the patient in January.  A  lipid panel showed elevation of the LDL and Crestor was started.  The patient says he has been feeling okay.  He denies chest pain.  No dizziness.  Breathing is okay.  Walks some on the treadmill.  Has problems walking outside on level ground.    Current Meds  Medication Sig  . amLODipine (NORVASC) 5 MG tablet Take 1 tablet (5 mg total) by mouth daily.  . clopidogrel (PLAVIX) 75 MG tablet Take 75 mg by mouth at bedtime.   Marland Kitchen glimepiride (AMARYL) 4 MG tablet Take 4 mg by mouth at bedtime.   Marland Kitchen losartan-hydrochlorothiazide (HYZAAR) 100-12.5 MG tablet Take 1 tablet by mouth daily.  . metFORMIN (GLUCOPHAGE-XR) 500 MG 24 hr tablet Take 1,000 mg by mouth 2 (two) times daily.  . metoprolol tartrate (LOPRESSOR) 50 MG tablet Take 50 mg by mouth 2 (two) times daily.  . rosuvastatin (CRESTOR) 10 MG tablet Take 1 tablet (10 mg total) by mouth daily.  . [DISCONTINUED] atorvastatin (LIPITOR) 80 MG tablet Take 1 tablet (80 mg total) by mouth daily.     Allergies:   Patient has no known allergies.   Past Medical History:  Diagnosis Date  . Coronary artery disease    pt reports he has stents  . Diabetes mellitus  without complication ([place])   . Hyperlipidemia   . Hypertension     Past Surgical History:  Procedure Laterality Date  . CARDIAC SURGERY       Social History:  The patient  reports that he quit smoking about 17 months ago. He has never used smokeless tobacco. He reports previous alcohol use. He reports that he does not use drugs.   Family History:  The patient's family history includes Alzheimer's disease in his mother; CAD in his brother; Heart attack in his father; Hypertension in his brother.    ROS:  Please see the history of present illness. All other systems are reviewed and  Negative to the above problem except as noted.    PHYSICAL EXAM: VS:  BP (!) 166/83   Pulse 82   Temp (!) 97 F (36.1 C) (Temporal)   Ht 5\' 11"  (1.803 m)   Wt 266 lb (120.7 kg)   SpO2 95%   BMI 37.10 kg/m   GEN: Morbidly obese 64 yo  in no acute distress  HEENT: normal  Neck: no JVD, carotid bruits, Cardiac: RRR; no murmurs, rubs, or gallops, trace edema  Respiratory:  clear to auscultation bilaterally,  normal work of breathing GI: soft, nontender, distended MS: no deformity Moving all extremities   Skin: warm and dry, no rash Neuro:  Strength and sensation are intact Psych: euthymic mood, full affect   EKG:  EKG is not ordered today.    Lipid Panel    Component Value Date/Time   CHOL 139 04/23/2019 1033   TRIG 155 (H) 04/23/2019 1033   HDL 25 (L) 04/23/2019 1033   CHOLHDL 5.6 (H) 04/23/2019 1033   LDLCALC 89 04/23/2019 1033      Wt Readings from Last 3 Encounters:  05/24/19 266 lb (120.7 kg)  04/22/19 260 lb (117.9 kg)  04/13/19 256 lb (116.1 kg)      ASSESSMENT AND PLAN:  1  HTN blood pressure still not optimally controlled.  I would recommend increasing amlodipine to 5 mg twice a day.  He is due to be seen by Dr. Katrinka Blazing soon.   I will set follow-up with me or B Strader  for later in the spring.  I also recomm that he have a sleep study to eval for apnea.  He snores a lot and  this may be contributing.  Unless treated, it would make control of blood pressure extremely difficult. I have told the patient to call if he has worsening of mild swelling in his legs with increase in amlodipine   2  Dizziness  Pt denies     3  Irreg HB P atient just turned in the event monitor that he wore.   It has not been sent back by company for review.  3  HL   patient is on Crestor now.  Will need lipids with AST and 1 more month.  Plan to treat more aggressively given he has peripheral vascular disease.  4  Hx PVOD   S/p iliac staent  On plavix  Denis symptomms now on Crestor will follow lipids  5  Hx DM last A1c 7.8.  Again needs tighter diet control.  I will set follow-up for June sooner with problems.  Again I think he should have a sleep study and will defer to have it done in [place] send Rx to show to Dr Katrinka Blazing     Stressed again importance of taking meds       Given that he is established with a provider through the [facility] system who is addressing above problems, I would not recomm f/u as initially planned     Signed, Dietrich Pates, MD  05/24/2019 10:07 AM    [facility] [address] [place], [place], [place]  [address] Phone: [phone]; Fax: [phone]

## 2019-05-24 ENCOUNTER — Other Ambulatory Visit: Payer: Self-pay

## 2019-05-24 ENCOUNTER — Ambulatory Visit (INDEPENDENT_AMBULATORY_CARE_PROVIDER_SITE_OTHER): Payer: Medicare Other | Admitting: Internal Medicine

## 2019-05-24 ENCOUNTER — Encounter: Payer: Self-pay | Admitting: Internal Medicine

## 2019-05-24 ENCOUNTER — Telehealth: Payer: Self-pay | Admitting: Internal Medicine

## 2019-05-24 VITALS — BP 166/83 | HR 82 | Temp 97.0°F | Ht 71.0 in | Wt 266.0 lb

## 2019-05-24 DIAGNOSIS — R0683 Snoring: Secondary | ICD-10-CM | POA: Diagnosis not present

## 2019-05-24 DIAGNOSIS — I10 Essential (primary) hypertension: Secondary | ICD-10-CM | POA: Diagnosis not present

## 2019-05-24 DIAGNOSIS — R002 Palpitations: Secondary | ICD-10-CM | POA: Diagnosis not present

## 2019-05-24 MED ORDER — AMLODIPINE BESYLATE 5 MG PO TABS: 5.0000 mg | ORAL_TABLET | Freq: Two times a day (BID) | ORAL | 3 refills | Status: DC

## 2019-05-24 NOTE — Telephone Encounter (Signed)
Patient calling stating he is interested in having his sleep study done in the [place] office.

## 2019-05-24 NOTE — Patient Instructions (Signed)
Medication Instructions:  Your physician has recommended you make the following change in your medication:  Increase Amlodipine to 5 mg Two Times Daily   *If you need a refill on your cardiac medications before your next appointment, please call your pharmacy*  Lab Work: NONE  If you have labs (blood work) drawn today and your tests are completely normal, you will receive your results only by: Marland Kitchen MyChart Message (if you have MyChart) OR . A paper copy in the mail If you have any lab test that is abnormal or we need to change your treatment, we will call you to review the results.  Testing/Procedures: NONE   Follow-Up: At [facility], you and your health needs are our priority.  As part of our continuing mission to provide you with exceptional heart care, we have created designated Provider Care Teams.  These Care Teams include your primary Cardiologist (physician) and Advanced Practice Providers (APPs -  Physician Assistants and Nurse Practitioners) who all work together to provide you with the care you need, when you need it.  Your next appointment:   4 month(s)  The format for your next appointment:   In Person  Provider:   Dietrich Pates, MD  Other Instructions Thank you for choosing [facility]!

## 2019-05-31 NOTE — Addendum Note (Signed)
Addended by: Kerney Elbe on: 05/31/2019 02:54 PM   Modules accepted: Orders

## 2019-06-22 ENCOUNTER — Other Ambulatory Visit: Payer: Self-pay | Admitting: Internal Medicine

## 2019-06-23 ENCOUNTER — Telehealth: Payer: Self-pay

## 2019-06-23 NOTE — Telephone Encounter (Signed)
I do not recomm nifedipine   He has been on amlodipine

## 2019-06-23 NOTE — Telephone Encounter (Signed)
Pharmacy is requesting to change the pt's Amlodipine to 10mg  (pt taking 0.5 tablet bid); or just 10mg  daily. Please give them a call back at [phone].

## 2019-07-01 NOTE — Telephone Encounter (Signed)
Called pharmacy to clarify dose and frequency of norvasc.

## 2019-09-13 ENCOUNTER — Other Ambulatory Visit: Payer: Self-pay | Admitting: Internal Medicine

## 2019-09-16 ENCOUNTER — Other Ambulatory Visit: Payer: Self-pay

## 2019-09-16 ENCOUNTER — Encounter: Payer: Self-pay | Admitting: Internal Medicine

## 2019-09-16 ENCOUNTER — Ambulatory Visit (INDEPENDENT_AMBULATORY_CARE_PROVIDER_SITE_OTHER): Payer: Medicare Other | Admitting: Internal Medicine

## 2019-09-16 VITALS — BP 126/70 | HR 70 | Ht 71.0 in | Wt 261.0 lb

## 2019-09-16 DIAGNOSIS — E782 Mixed hyperlipidemia: Secondary | ICD-10-CM | POA: Diagnosis not present

## 2019-09-16 DIAGNOSIS — I10 Essential (primary) hypertension: Secondary | ICD-10-CM | POA: Diagnosis not present

## 2019-09-16 NOTE — Progress Notes (Signed)
Cardiology Office Note   Date:  09/16/2019   ID:  Kyle Marks, DOB 08-20-1955, MRN [id number]  PCP:  Salome Holmes, MD  Cardiologist:   Dietrich Pates, MD   F/U of HTN    History of Present Illness: Kyle Marks is a 64 y.o. male with a history of HTN, DM, PVOD (s/p iliac stent), HL    He was seen at [facility] ED on 11/25/18 with dizziness.   Pt reports positional, worse with looking down, turnnig head too fast, change in position.   This resolved  I saw the pt in the winter   Crestor added to regimen  Since I saw him he has done OK   Denies CP  Breathing is OK   He is going to the gym regularly and walking on treadmill    He has had COVID vaccines  Current Meds  Medication Sig  . amLODipine (NORVASC) 10 MG tablet TAKE ONE-HALF (5MG ) TABLET BY MOUTH TWICE A DAY  . clopidogrel (PLAVIX) 75 MG tablet Take 75 mg by mouth at bedtime.   glimepiride (AMARYL) 4 MG tablet Take 4 mg by mouth at bedtime.   Marland Kitchen losartan-hydrochlorothiazide (HYZAAR) 100-12.5 MG tablet Take 1 tablet by mouth daily.  . metFORMIN (GLUCOPHAGE-XR) 500 MG 24 hr tablet Take 1,000 mg by mouth 2 (two) times daily.  . metoprolol tartrate (LOPRESSOR) 50 MG tablet Take 50 mg by mouth 2 (two) times daily.  . rosuvastatin (CRESTOR) 10 MG tablet Take 1 tablet (10 mg total) by mouth daily.     Allergies:   Patient has no known allergies.   Past Medical History:  Diagnosis Date  . Coronary artery disease    pt reports he has stents  . Diabetes mellitus without complication (HCC)   . Hyperlipidemia   . Hypertension     Past Surgical History:  Procedure Laterality Date  . CARDIAC SURGERY       Social History:  The patient  reports that he quit smoking about 21 months ago. He has never used smokeless tobacco. He reports previous alcohol use. He reports that he does not use drugs.   Family History:  The patient's family history includes Alzheimer's disease in his mother; CAD in his brother; Heart attack in  his father; Hypertension in his brother.    ROS:  Please see the history of present illness. All other systems are reviewed and  Negative to the above problem except as noted.    PHYSICAL EXAM: VS:  BP 126/70   Pulse 70   Ht 5\' 11"  (1.803 m)   Wt 261 lb (118.4 kg)   SpO2 98%   BMI 36.40 kg/m   GEN: Morbidly obese 64 yo  in no acute distress  HEENT: normal  Neck: no JVD, carotid bruits, Cardiac: RRR; no murmurs, rubs, or gallops, No LE  edema  Respiratory:  clear to auscultation bilaterally, normal work of breathing GI: soft, nontender, distended MS: no deformity Moving all extremities   Skin: warm and dry, no rash Neuro:  Strength and sensation are intact Psych: euthymic mood, full affect   EKG:  EKG is not ordered today.    Lipid Panel    Component Value Date/Time   CHOL 139 04/23/2019 1033   TRIG 155 (H) 04/23/2019 1033   HDL 25 (L) 04/23/2019 1033   CHOLHDL 5.6 (H) 04/23/2019 1033   LDLCALC 89 04/23/2019 1033      Wt Readings from Last 3 Encounters:  09/16/19  261 lb (118.4 kg)  05/24/19 266 lb (120.7 kg)  04/22/19 260 lb (117.9 kg)      ASSESSMENT AND PLAN:  1  HTN BP is controlled on current regimen  COntinue   2  Dizziness  Pt denies     3  Irreg HB Pt had monitor in past    No arrhythmia detected   Denies symptoms now   3  HL  On Crestor   He will see Dr Tamala Julian   Fax labs from that visit  4  Hx PVOD   S/p iliac stent  On plavix  Continue   Minimal cramping in legs   5  Hx DM Will review A1C from visit  F/U in Jan / Feb,  Sooner for probems   Given that he is established with a provider through the [facility] system who is addressing above problems, I would not recomm f/u as initially planned     Signed, Dorris Carnes, MD  09/16/2019 9:47 [address], [place], [place]  [address] Phone: [phone]; Fax: [phone]

## 2019-09-16 NOTE — Patient Instructions (Signed)
Medication Instructions:  Your physician recommends that you continue on your current medications as directed. Please refer to the Current Medication list given to you today.  *If you need a refill on your cardiac medications before your next appointment, please call your pharmacy*   Lab Work: Lab request given to patient   If you have labs (blood work) drawn today and your tests are completely normal, you will receive your results only by: Marland Kitchen MyChart Message (if you have MyChart) OR . A paper copy in the mail If you have any lab test that is abnormal or we need to change your treatment, we will call you to review the results.   Testing/Procedures: None today   Follow-Up: At [facility], you and your health needs are our priority.  As part of our continuing mission to provide you with exceptional heart care, we have created designated Provider Care Teams.  These Care Teams include your primary Cardiologist (physician) and Advanced Practice Providers (APPs -  Physician Assistants and Nurse Practitioners) who all work together to provide you with the care you need, when you need it.  We recommend signing up for the patient portal called "MyChart".  Sign up information is provided on this After Visit Summary.  MyChart is used to connect with patients for Virtual Visits (Telemedicine).  Patients are able to view lab/test results, encounter notes, upcoming appointments, etc.  Non-urgent messages can be sent to your provider as well.   To learn more about what you can do with MyChart, go to [email or web address].    Your next appointment:   7 month(s)  The format for your next appointment:   In Person  Provider:   Dietrich Pates, MD   Other Instructions None        Thank you for choosing [facility] !

## 2020-02-22 ENCOUNTER — Other Ambulatory Visit: Payer: Self-pay | Admitting: Internal Medicine

## 2020-04-13 ENCOUNTER — Encounter: Payer: Self-pay | Admitting: Internal Medicine

## 2020-04-13 ENCOUNTER — Other Ambulatory Visit: Payer: Self-pay

## 2020-04-13 ENCOUNTER — Other Ambulatory Visit (HOSPITAL_COMMUNITY)
Admission: RE | Admit: 2020-04-13 | Discharge: 2020-04-13 | Disposition: A | Discharge status: Home / Self Care | Payer: Medicare Other | Source: Ambulatory Visit | Attending: Internal Medicine | Admitting: Internal Medicine

## 2020-04-13 ENCOUNTER — Ambulatory Visit (INDEPENDENT_AMBULATORY_CARE_PROVIDER_SITE_OTHER): Payer: Medicare Other | Admitting: Internal Medicine

## 2020-04-13 VITALS — BP 140/80 | HR 70 | Ht 71.0 in | Wt 254.6 lb

## 2020-04-13 DIAGNOSIS — E782 Mixed hyperlipidemia: Secondary | ICD-10-CM

## 2020-04-13 DIAGNOSIS — I10 Essential (primary) hypertension: Secondary | ICD-10-CM

## 2020-04-13 DIAGNOSIS — I499 Cardiac arrhythmia, unspecified: Secondary | ICD-10-CM

## 2020-04-13 DIAGNOSIS — R002 Palpitations: Secondary | ICD-10-CM | POA: Diagnosis present

## 2020-04-13 LAB — BASIC METABOLIC PANEL
Anion gap: 10 (ref 5–15)
BUN: 16 mg/dL (ref 8–23)
CO2: 25 mmol/L (ref 22–32)
Calcium: 9.4 mg/dL (ref 8.9–10.3)
Chloride: 99 mmol/L (ref 98–111)
Creatinine, Ser: 1.44 mg/dL — ABNORMAL HIGH (ref 0.61–1.24)
GFR, Estimated: 54 mL/min — ABNORMAL LOW (ref >60)
Glucose, Bld: 213 mg/dL — ABNORMAL HIGH (ref 70–99)
Potassium: 4.8 mmol/L (ref 3.5–5.1)
Sodium: 134 mmol/L — ABNORMAL LOW (ref 135–145)

## 2020-04-13 LAB — LIPID PANEL
Cholesterol: 144 mg/dL (ref 0–200)
HDL: 29 mg/dL — ABNORMAL LOW (ref >40)
LDL Cholesterol: 89 mg/dL (ref 0–99)
Total CHOL/HDL Ratio: 5 RATIO
Triglycerides: 130 mg/dL (ref <150)
VLDL: 26 mg/dL (ref 0–40)

## 2020-04-13 LAB — CBC
HCT: 42.9 % (ref 39.0–52.0)
Hemoglobin: 13.8 g/dL (ref 13.0–17.0)
MCH: 28.8 pg (ref 26.0–34.0)
MCHC: 32.2 g/dL (ref 30.0–36.0)
MCV: 89.6 fL (ref 80.0–100.0)
Platelets: 291 10*3/uL (ref 150–400)
RBC: 4.79 MIL/uL (ref 4.22–5.81)
RDW: 13.3 % (ref 11.5–15.5)
WBC: 8.5 10*3/uL (ref 4.0–10.5)
nRBC: 0 % (ref 0.0–0.2)

## 2020-04-13 LAB — TSH: TSH: 3.389 u[IU]/mL (ref 0.350–4.500)

## 2020-04-13 NOTE — Progress Notes (Signed)
Cardiology Office Note   Date:  04/13/2020   ID:  Kyle Marks, DOB 10-14-1955, MRN [id number]  PCP:  Salome Holmes, MD  Cardiologist:   Dietrich Pates, MD   F/U of HTN    History of Present Illness: Kyle Marks is a 64 y.o. male with a history of HTN, DM, PVOD (s/p iliac stent), HL    He was seen at [facility] ED on 11/25/18 with dizziness.   Pt reports positional, worse with looking down, turnnig head too fast, change in position.   This resolved  He [place] last seen [place] in June  2021 Since seen he has done OK  He denies CP  Breathing is OK He is walking about 1.5 miles per day Working on diet    Br  4x per wk eggs and bacon  Coffee Lunch  Usually skips Dinner   Soup   Occasional dessert   Current Meds  Medication Sig  . amLODipine (NORVASC) 10 MG tablet TAKE ONE-HALF (5MG ) TABLET BY MOUTH TWICE A DAY  . amLODipine (NORVASC) 5 MG tablet Take 1 tablet (5 mg total) by mouth 2 (two) times daily.  atorvastatin (LIPITOR) 40 MG tablet Take by mouth.  . clopidogrel (PLAVIX) 75 MG tablet Take 75 mg by mouth at bedtime.   Marland Kitchen glimepiride (AMARYL) 4 MG tablet Take 4 mg by mouth at bedtime.   Marland Kitchen losartan-hydrochlorothiazide (HYZAAR) 100-12.5 MG tablet Take 1 tablet by mouth daily.  . metFORMIN (GLUCOPHAGE-XR) 500 MG 24 hr tablet Take 1,000 mg by mouth 2 (two) times daily.  . metoprolol tartrate (LOPRESSOR) 50 MG tablet Take 50 mg by mouth 2 (two) times daily.  . rosuvastatin (CRESTOR) 10 MG tablet Take 1 tablet (10 mg total) by mouth daily.     Allergies:   Patient has no known allergies.   Past Medical History:  Diagnosis Date  . Coronary artery disease    pt reports he has stents  . Diabetes mellitus without complication (HCC)   . Hyperlipidemia   . Hypertension     Past Surgical History:  Procedure Laterality Date  . CARDIAC SURGERY       Social History:  The patient  reports that he quit smoking about 2 years ago. He has never used smokeless tobacco. He  reports previous alcohol use. He reports that he does not use drugs.   Family History:  The patient's family history includes Alzheimer's disease in his mother; CAD in his brother; Heart attack in his father; Hypertension in his brother.    ROS:  Please see the history of present illness. All other systems are reviewed and  Negative to the above problem except as noted.    PHYSICAL EXAM: VS:  BP 140/80   Pulse 70   Ht 5\' 11"  (1.803 m)   Wt 254 lb 9.6 oz (115.5 kg)   SpO2 96%   BMI 35.51 kg/m   GEN: Morbidly obese 64 yo  in no acute distress  HEENT: normal  Neck: no JVD,  No carotid bruits, Cardiac: RRR; no murmurs No LE  edema  Respiratory:  clear to auscultation bilaterally, normal work of breathing GI: soft, nontender, distended  No masses MS: no deformity Moving all extremities   Skin: warm and dry, no rash Neuro:  Strength and sensation are intact Psych: euthymic mood, full affect   EKG:  EKG is ordered today.  SR 70 bpm    Lipid Panel    Component Value Date/Time  CHOL 139 04/23/2019 1033   TRIG 155 (H) 04/23/2019 1033   HDL 25 (L) 04/23/2019 1033   CHOLHDL 5.6 (H) 04/23/2019 1033   LDLCALC 89 04/23/2019 1033      Wt Readings from Last 3 Encounters:  04/13/20 254 lb 9.6 oz (115.5 kg)  09/16/19 261 lb (118.4 kg)  05/24/19 266 lb (120.7 kg)      ASSESSMENT AND PLAN:  1  HTN BP is fair  I would keep on same regimen  Get labs  Encouraged wt loss  2  Dizziness  Pt denies     3  Irreg HB Pt had monitor in past   Denies palpitations    3  HL  On Crestor  Will get lipds   4  Hx PVOD   S/p iliac stent  On plavix  Continue  No symptoms   5  Hx DM Will check A1C  6  Obesity   Disucssed TRE and minimizing carbs  F/U in late summer   Will look into rec for PCP for pt   [place] someone [place]  F/U in Jan / Feb,  Sooner for probems   Given that he is established with a provider through the [facility] system who is addressing above problems, I  would not recomm f/u as initially planned     Signed, Dietrich Pates, MD  04/13/2020 1:15 PM    [facility] [address] [place], [place], [place]  [address] Phone: [phone]; Fax: [phone]

## 2020-04-13 NOTE — Patient Instructions (Signed)
Medication Instructions:  Your physician recommends that you continue on your current medications as directed. Please refer to the Current Medication list given to you today.  *If you need a refill on your cardiac medications before your next appointment, please call your pharmacy*   Lab Work: Your physician recommends that you return for lab work in: Today   If you have labs (blood work) drawn today and your tests are completely normal, you will receive your results only by:  MyChart Message (if you have MyChart) OR  A paper copy in the mail If you have any lab test that is abnormal or we need to change your treatment, we will call you to review the results.   Testing/Procedures: NONE    Follow-Up: At [facility], you and your health needs are our priority.  As part of our continuing mission to provide you with exceptional heart care, we have created designated Provider Care Teams.  These Care Teams include your primary Cardiologist (physician) and Advanced Practice Providers (APPs -  Physician Assistants and Nurse Practitioners) who all work together to provide you with the care you need, when you need it.  We recommend signing up for the patient portal called "MyChart".  Sign up information is provided on this After Visit Summary.  MyChart is used to connect with patients for Virtual Visits (Telemedicine).  Patients are able to view lab/test results, encounter notes, upcoming appointments, etc.  Non-urgent messages can be sent to your provider as well.   To learn more about what you can do with MyChart, go to [email or web address].    Your next appointment:    July 2022   The format for your next appointment:   In Person  Provider:   Dietrich Pates, MD   Other Instructions Thank you for choosing [facility]!

## 2020-04-14 LAB — HEMOGLOBIN A1C
Hgb A1c MFr Bld: 9.4 % — ABNORMAL HIGH (ref 4.8–5.6)
Mean Plasma Glucose: 223.08 mg/dL

## 2020-04-17 ENCOUNTER — Telehealth: Payer: Self-pay

## 2020-04-17 NOTE — Telephone Encounter (Signed)
-----   Message from Nori Riis, RN sent at 04/17/2020  9:16 AM EST -----  ----- Message ----- From: Pricilla Riffle, MD Sent: 04/15/2020  10:49 PM EST To: Nori Riis, RN  Hgb A1 C is 9.4     High   Watch carbs    See if pt interesting in meetin with dietician   Should be covered by insurance

## 2020-04-17 NOTE — Telephone Encounter (Signed)
Spoke with patient. He verbalized understanding of results and Hgb A1C being elevated. He denied the desire to see a dietician, but would rather see a Endocrinologist.

## 2020-04-18 ENCOUNTER — Telehealth: Payer: Self-pay

## 2020-04-18 DIAGNOSIS — E782 Mixed hyperlipidemia: Secondary | ICD-10-CM

## 2020-04-18 MED ORDER — EZETIMIBE 10 MG PO TABS: 10.0000 mg | ORAL_TABLET | Freq: Every day | ORAL | 3 refills | Status: DC

## 2020-04-18 NOTE — Telephone Encounter (Signed)
-----   Message from Pricilla Riffle, MD sent at 04/13/2020  9:35 PM EST ----- CBC is normal  Thyroid function is normal  Kidney funciton is stable  GLucose elevated   Otherwise electrolytes are OK With vascular dz, LDL should be lower   Would add Zetia to regimen   Keep on lipitor  Follow up lipids in 8 wks with AST

## 2020-04-18 NOTE — Telephone Encounter (Signed)
Discussed lab results with patient.He agrees to start Zetia 10 mg daily, e-scribed to [facility].Will repeat Lipids in 8 weeks at [facility] out patient lab.

## 2020-05-04 ENCOUNTER — Other Ambulatory Visit: Payer: Self-pay | Admitting: Internal Medicine

## 2020-05-04 NOTE — Telephone Encounter (Signed)
This is a [facility] pt.  °

## 2020-06-01 ENCOUNTER — Other Ambulatory Visit: Payer: Self-pay

## 2020-06-01 ENCOUNTER — Emergency Department (HOSPITAL_COMMUNITY): Payer: Medicare Other

## 2020-06-01 ENCOUNTER — Encounter (HOSPITAL_COMMUNITY): Payer: Self-pay | Admitting: *Deleted

## 2020-06-01 ENCOUNTER — Observation Stay (HOSPITAL_COMMUNITY)
Admission: EM | Admit: 2020-06-01 | Discharge: 2020-06-02 | Disposition: A | Discharge status: Home / Self Care | Payer: Medicare Other | Attending: Internal Medicine | Admitting: Internal Medicine

## 2020-06-01 DIAGNOSIS — Z79899 Other long term (current) drug therapy: Secondary | ICD-10-CM | POA: Insufficient documentation

## 2020-06-01 DIAGNOSIS — Z87891 Personal history of nicotine dependence: Secondary | ICD-10-CM | POA: Diagnosis not present

## 2020-06-01 DIAGNOSIS — N1832 Chronic kidney disease, stage 3b: Secondary | ICD-10-CM | POA: Diagnosis not present

## 2020-06-01 DIAGNOSIS — E785 Hyperlipidemia, unspecified: Secondary | ICD-10-CM | POA: Diagnosis not present

## 2020-06-01 DIAGNOSIS — R200 Anesthesia of skin: Secondary | ICD-10-CM | POA: Insufficient documentation

## 2020-06-01 DIAGNOSIS — Z6834 Body mass index (BMI) 34.0-34.9, adult: Secondary | ICD-10-CM

## 2020-06-01 DIAGNOSIS — N183 Chronic kidney disease, stage 3 unspecified: Secondary | ICD-10-CM

## 2020-06-01 DIAGNOSIS — G459 Transient cerebral ischemic attack, unspecified: Secondary | ICD-10-CM | POA: Diagnosis not present

## 2020-06-01 DIAGNOSIS — Z955 Presence of coronary angioplasty implant and graft: Secondary | ICD-10-CM | POA: Diagnosis not present

## 2020-06-01 DIAGNOSIS — I639 Cerebral infarction, unspecified: Secondary | ICD-10-CM | POA: Diagnosis present

## 2020-06-01 DIAGNOSIS — Z20822 Contact with and (suspected) exposure to covid-19: Secondary | ICD-10-CM | POA: Diagnosis not present

## 2020-06-01 DIAGNOSIS — I6350 Cerebral infarction due to unspecified occlusion or stenosis of unspecified cerebral artery: Secondary | ICD-10-CM | POA: Insufficient documentation

## 2020-06-01 DIAGNOSIS — Z7984 Long term (current) use of oral hypoglycemic drugs: Secondary | ICD-10-CM | POA: Diagnosis not present

## 2020-06-01 DIAGNOSIS — E1122 Type 2 diabetes mellitus with diabetic chronic kidney disease: Secondary | ICD-10-CM | POA: Insufficient documentation

## 2020-06-01 DIAGNOSIS — I10 Essential (primary) hypertension: Secondary | ICD-10-CM

## 2020-06-01 DIAGNOSIS — I129 Hypertensive chronic kidney disease with stage 1 through stage 4 chronic kidney disease, or unspecified chronic kidney disease: Secondary | ICD-10-CM | POA: Diagnosis not present

## 2020-06-01 DIAGNOSIS — I2510 Atherosclerotic heart disease of native coronary artery without angina pectoris: Secondary | ICD-10-CM | POA: Insufficient documentation

## 2020-06-01 DIAGNOSIS — Z7902 Long term (current) use of antithrombotics/antiplatelets: Secondary | ICD-10-CM | POA: Insufficient documentation

## 2020-06-01 DIAGNOSIS — R531 Weakness: Secondary | ICD-10-CM | POA: Insufficient documentation

## 2020-06-01 DIAGNOSIS — Z8673 Personal history of transient ischemic attack (TIA), and cerebral infarction without residual deficits: Secondary | ICD-10-CM | POA: Insufficient documentation

## 2020-06-01 DIAGNOSIS — E119 Type 2 diabetes mellitus without complications: Secondary | ICD-10-CM

## 2020-06-01 DIAGNOSIS — N182 Chronic kidney disease, stage 2 (mild): Secondary | ICD-10-CM

## 2020-06-01 DIAGNOSIS — R2689 Other abnormalities of gait and mobility: Secondary | ICD-10-CM | POA: Insufficient documentation

## 2020-06-01 DIAGNOSIS — E6609 Other obesity due to excess calories: Secondary | ICD-10-CM

## 2020-06-01 DIAGNOSIS — I63131 Cerebral infarction due to embolism of right carotid artery: Principal | ICD-10-CM | POA: Insufficient documentation

## 2020-06-01 LAB — URINALYSIS, ROUTINE W REFLEX MICROSCOPIC
Bacteria, UA: NONE SEEN
Bilirubin Urine: NEGATIVE
Glucose, UA: NEGATIVE mg/dL
Hgb urine dipstick: NEGATIVE
Ketones, ur: 5 mg/dL — AB
Leukocytes,Ua: NEGATIVE
Nitrite: NEGATIVE
Protein, ur: 100 mg/dL — AB
Specific Gravity, Urine: 1.014 (ref 1.005–1.030)
pH: 5 (ref 5.0–8.0)

## 2020-06-01 LAB — RAPID URINE DRUG SCREEN, HOSP PERFORMED
Amphetamines: NOT DETECTED
Barbiturates: NOT DETECTED
Benzodiazepines: NOT DETECTED
Cocaine: NOT DETECTED
Opiates: NOT DETECTED
Tetrahydrocannabinol: NOT DETECTED

## 2020-06-01 LAB — CBC
HCT: 43 % (ref 39.0–52.0)
HCT: 42.5 % (ref 39.0–52.0)
Hemoglobin: 13.7 g/dL (ref 13.0–17.0)
Hemoglobin: 13.7 g/dL (ref 13.0–17.0)
MCH: 29 pg (ref 26.0–34.0)
MCH: 29.6 pg (ref 26.0–34.0)
MCHC: 31.9 g/dL (ref 30.0–36.0)
MCHC: 32.2 g/dL (ref 30.0–36.0)
MCV: 90.9 fL (ref 80.0–100.0)
MCV: 91.8 fL (ref 80.0–100.0)
Platelets: 183 10*3/uL (ref 150–400)
Platelets: 193 10*3/uL (ref 150–400)
RBC: 4.73 MIL/uL (ref 4.22–5.81)
RBC: 4.63 MIL/uL (ref 4.22–5.81)
RDW: 15.8 % — ABNORMAL HIGH (ref 11.5–15.5)
RDW: 15.8 % — ABNORMAL HIGH (ref 11.5–15.5)
WBC: 6.9 10*3/uL (ref 4.0–10.5)
WBC: 7.1 10*3/uL (ref 4.0–10.5)
nRBC: 0 % (ref 0.0–0.2)
nRBC: 0 % (ref 0.0–0.2)

## 2020-06-01 LAB — I-STAT CHEM 8, ED
BUN: 22 mg/dL (ref 8–23)
Calcium, Ion: 1.18 mmol/L (ref 1.15–1.40)
Chloride: 105 mmol/L (ref 98–111)
Creatinine, Ser: 1.9 mg/dL — ABNORMAL HIGH (ref 0.61–1.24)
Glucose, Bld: 104 mg/dL — ABNORMAL HIGH (ref 70–99)
HCT: 42 % (ref 39.0–52.0)
Hemoglobin: 14.3 g/dL (ref 13.0–17.0)
Potassium: 3.5 mmol/L (ref 3.5–5.1)
Sodium: 140 mmol/L (ref 135–145)
TCO2: 23 mmol/L (ref 22–32)

## 2020-06-01 LAB — DIFFERENTIAL
Abs Immature Granulocytes: 0.02 10*3/uL (ref 0.00–0.07)
Basophils Absolute: 0.1 10*3/uL (ref 0.0–0.1)
Basophils Relative: 1 %
Eosinophils Absolute: 0.1 10*3/uL (ref 0.0–0.5)
Eosinophils Relative: 1 %
Immature Granulocytes: 0 %
Lymphocytes Relative: 18 %
Lymphs Abs: 1.2 10*3/uL (ref 0.7–4.0)
Monocytes Absolute: 0.4 10*3/uL (ref 0.1–1.0)
Monocytes Relative: 6 %
Neutro Abs: 5.2 10*3/uL (ref 1.7–7.7)
Neutrophils Relative %: 74 %

## 2020-06-01 LAB — GLUCOSE, CAPILLARY
Glucose-Capillary: 65 mg/dL — ABNORMAL LOW (ref 70–99)
Glucose-Capillary: 155 mg/dL — ABNORMAL HIGH (ref 70–99)

## 2020-06-01 LAB — COMPREHENSIVE METABOLIC PANEL
ALT: 25 U/L (ref 0–44)
AST: 21 U/L (ref 15–41)
Albumin: 3.9 g/dL (ref 3.5–5.0)
Alkaline Phosphatase: 39 U/L (ref 38–126)
Anion gap: 9 (ref 5–15)
BUN: 22 mg/dL (ref 8–23)
CO2: 21 mmol/L — ABNORMAL LOW (ref 22–32)
Calcium: 8.6 mg/dL — ABNORMAL LOW (ref 8.9–10.3)
Chloride: 105 mmol/L (ref 98–111)
Creatinine, Ser: 1.89 mg/dL — ABNORMAL HIGH (ref 0.61–1.24)
GFR, Estimated: 39 mL/min — ABNORMAL LOW (ref >60)
Glucose, Bld: 111 mg/dL — ABNORMAL HIGH (ref 70–99)
Potassium: 3.4 mmol/L — ABNORMAL LOW (ref 3.5–5.1)
Sodium: 135 mmol/L (ref 135–145)
Total Bilirubin: 0.7 mg/dL (ref 0.3–1.2)
Total Protein: 6.6 g/dL (ref 6.5–8.1)

## 2020-06-01 LAB — ETHANOL: Alcohol, Ethyl (B): <10 mg/dL (ref <10)

## 2020-06-01 LAB — RESP PANEL BY RT-PCR (FLU A&B, COVID) ARPGX2
Influenza A by PCR: NEGATIVE
Influenza B by PCR: NEGATIVE
SARS Coronavirus 2 by RT PCR: NEGATIVE

## 2020-06-01 LAB — APTT: aPTT: 30 seconds (ref 24–36)

## 2020-06-01 LAB — CREATININE, SERUM
Creatinine, Ser: 1.86 mg/dL — ABNORMAL HIGH (ref 0.61–1.24)
GFR, Estimated: 40 mL/min — ABNORMAL LOW (ref >60)

## 2020-06-01 LAB — PROTIME-INR
INR: 1.1 (ref 0.8–1.2)
Prothrombin Time: 13.3 seconds (ref 11.4–15.2)

## 2020-06-01 LAB — CBG MONITORING, ED: Glucose-Capillary: 110 mg/dL — ABNORMAL HIGH (ref 70–99)

## 2020-06-01 MED ORDER — ONDANSETRON HCL 4 MG PO TABS: 4.0000 mg | ORAL_TABLET | Freq: Four times a day (QID) | ORAL | Status: DC | PRN

## 2020-06-01 MED ORDER — ROSUVASTATIN CALCIUM 10 MG PO TABS
10.0000 mg | ORAL_TABLET | Freq: Every day | ORAL | Status: DC
  Administered 2020-06-02: 10 mg via ORAL
  Filled 2020-06-01: qty 1

## 2020-06-01 MED ORDER — ASPIRIN 325 MG PO TABS
325.0000 mg | ORAL_TABLET | Freq: Once | ORAL | Status: AC
  Administered 2020-06-01: 325 mg via ORAL
  Filled 2020-06-01: qty 1

## 2020-06-01 MED ORDER — LOSARTAN POTASSIUM-HCTZ 100-12.5 MG PO TABS: 1.0000 | ORAL_TABLET | Freq: Every day | ORAL | Status: DC

## 2020-06-01 MED ORDER — AMLODIPINE BESYLATE 5 MG PO TABS
10.0000 mg | ORAL_TABLET | Freq: Every day | ORAL | Status: DC
  Administered 2020-06-02: 10 mg via ORAL
  Filled 2020-06-01: qty 2

## 2020-06-01 MED ORDER — ENOXAPARIN SODIUM 40 MG/0.4ML ~~LOC~~ SOLN: 40.0000 mg | SUBCUTANEOUS | Status: DC

## 2020-06-01 MED ORDER — ASPIRIN EC 81 MG PO TBEC
81.0000 mg | DELAYED_RELEASE_TABLET | Freq: Every day | ORAL | Status: DC
  Administered 2020-06-02: 81 mg via ORAL
  Filled 2020-06-01: qty 1

## 2020-06-01 MED ORDER — ASPIRIN 325 MG PO TABS: 325.0000 mg | ORAL_TABLET | Freq: Every day | ORAL | Status: DC

## 2020-06-01 MED ORDER — ACETAMINOPHEN 650 MG RE SUPP: 650.0000 mg | Freq: Four times a day (QID) | RECTAL | Status: DC | PRN

## 2020-06-01 MED ORDER — STROKE: EARLY STAGES OF RECOVERY BOOK: Freq: Once | Status: AC

## 2020-06-01 MED ORDER — TICAGRELOR 90 MG PO TABS
180.0000 mg | ORAL_TABLET | Freq: Once | ORAL | Status: AC
  Administered 2020-06-01: 180 mg via ORAL
  Filled 2020-06-01: qty 2

## 2020-06-01 MED ORDER — ENOXAPARIN SODIUM 60 MG/0.6ML ~~LOC~~ SOLN
0.5000 mg/kg | SUBCUTANEOUS | Status: DC
  Administered 2020-06-01: 55 mg via SUBCUTANEOUS
  Filled 2020-06-01: qty 0.6

## 2020-06-01 MED ORDER — TICAGRELOR 90 MG PO TABS
90.0000 mg | ORAL_TABLET | Freq: Two times a day (BID) | ORAL | Status: DC
  Administered 2020-06-02: 90 mg via ORAL
  Filled 2020-06-01: qty 1

## 2020-06-01 MED ORDER — INSULIN ASPART 100 UNIT/ML ~~LOC~~ SOLN
0.0000 [IU] | Freq: Three times a day (TID) | SUBCUTANEOUS | Status: DC
  Administered 2020-06-02: 3 [IU] via SUBCUTANEOUS

## 2020-06-01 MED ORDER — ATORVASTATIN CALCIUM 40 MG PO TABS: 40.0000 mg | ORAL_TABLET | Freq: Every day | ORAL | Status: DC

## 2020-06-01 MED ORDER — EZETIMIBE 10 MG PO TABS: 10.0000 mg | ORAL_TABLET | Freq: Every day | ORAL | Status: DC

## 2020-06-01 MED ORDER — ACETAMINOPHEN 325 MG PO TABS: 650.0000 mg | ORAL_TABLET | Freq: Four times a day (QID) | ORAL | Status: DC | PRN

## 2020-06-01 MED ORDER — HYDROCHLOROTHIAZIDE 12.5 MG PO CAPS: 12.5000 mg | ORAL_CAPSULE | Freq: Every day | ORAL | Status: DC

## 2020-06-01 MED ORDER — LOSARTAN POTASSIUM 50 MG PO TABS: 100.0000 mg | ORAL_TABLET | Freq: Every day | ORAL | Status: DC

## 2020-06-01 MED ORDER — METOPROLOL TARTRATE 50 MG PO TABS
50.0000 mg | ORAL_TABLET | Freq: Two times a day (BID) | ORAL | Status: DC
  Administered 2020-06-01 – 2020-06-02 (×2): 50 mg via ORAL
  Filled 2020-06-01 (×2): qty 1

## 2020-06-01 MED ORDER — AMLODIPINE BESYLATE 5 MG PO TABS: 5.0000 mg | ORAL_TABLET | Freq: Every day | ORAL | Status: DC

## 2020-06-01 MED ORDER — ONDANSETRON HCL 4 MG/2ML IJ SOLN: 4.0000 mg | Freq: Four times a day (QID) | INTRAMUSCULAR | Status: DC | PRN

## 2020-06-01 MED ORDER — CLOPIDOGREL BISULFATE 75 MG PO TABS: 75.0000 mg | ORAL_TABLET | Freq: Every day | ORAL | Status: DC

## 2020-06-01 NOTE — ED Triage Notes (Signed)
Pt c/o right sided numbness to right arm and leg that happened while out shopping at 1500 today. Pt reports he now feels mostly normal. Pt reports he felt like his blood sugar was possibly low because he has been dieting and working out so he was given some sugar by bystander.   Dr. Deretha Emory called to bedside due to possible code stroke.   LKW 1500 today.

## 2020-06-01 NOTE — Progress Notes (Signed)
Code Stroke Time Documentation   1655 Call Time 1656 Beeper Time 1704 Exam Started  1707 Exam Finished 1707 Images sent to [facility] 1707 Exam completed in Epic  [address] [facility] radiology called

## 2020-06-01 NOTE — H&P (Signed)
History and Physical    Kyle Marks ZOX:[id number] DOB: 07-22-1955 DOA: 06/01/2020  PCP: Salome Holmes, MD   Patient coming from: Home   Chief Complaint:  Right sided upper and lower extremity numbness   HPI: Kyle Marks is a 65 y.o. male with medical history significant of peripheral vascular disease, coronary artery disease, type 2 diabetes mellitus, hypertension dyslipidemia who presents with focal neurologic deficit. He was at a local grocery store when he suddenly experienced right lower and upper extremity numbness and weakness, predominantly on his lower extremity.  Moderate to severe intensity, to the point where he had difficulty ambulating, no improving or worsening factors.  No associated pain.  Started about 3:45 PM and lasted for about 35 to 40 minutes.  His wife drove him to the ED by car.  By the time he reached the ED his symptoms had resolved.   ED Course: Patient underwent code stroke, MRI revealed positive CVA, neurology recommended antiplatelet therapy and admission for further evaluation.  Review of Systems:  1. General: No fevers, no chills, no weight gain or weight loss 2. ENT: No runny nose or sore throat, no hearing disturbances 3. Pulmonary: No dyspnea, cough, wheezing, or hemoptysis 4. Cardiovascular: No angina, claudication, lower extremity edema, pnd or orthopnea 5. Gastrointestinal: No nausea or vomiting, no diarrhea or constipation 6. Hematology: No easy bruisability or frequent infections 7. Urology: No dysuria, hematuria or increased urinary frequency 8. Dermatology: No rashes. 9. Neurology: No seizures or paresthesias.  Positive right-sided weakness as mentioned in history of present illness. 10. Musculoskeletal: No joint pain or deformities  Past Medical History:  Diagnosis Date  . Coronary artery disease    pt reports he has stents  . Diabetes mellitus without complication (HCC)   . Hyperlipidemia   . Hypertension     Past  Surgical History:  Procedure Laterality Date  . CARDIAC SURGERY       reports that he quit smoking about 2 years ago. He has never used smokeless tobacco. He reports previous alcohol use. He reports that he does not use drugs.  No Known Allergies  Family History  Problem Relation Age of Onset  . Alzheimer's disease Mother   . Heart attack Father   . Hypertension Brother   . CAD Brother      Prior to Admission medications   Medication Sig Start Date End Date Taking? Authorizing Provider  amLODipine (NORVASC) 10 MG tablet TAKE ONE-HALF (5MG ) TABLET BY MOUTH TWICE A DAY 02/22/20   13/9/21, MD  amLODipine (NORVASC) 5 MG tablet Take 1 tablet (5 mg total) by mouth 2 (two) times daily. 05/24/19 08/22/19  10/22/19, MD  atorvastatin (LIPITOR) 40 MG tablet Take by mouth. 12/17/18   [provider]  clopidogrel (PLAVIX) 75 MG tablet Take 75 mg by mouth at bedtime.  10/21/18   [provider]  ezetimibe (ZETIA) 10 MG tablet Take 1 tablet (10 mg total) by mouth daily. 04/18/20 07/17/20  09/16/20, MD  glimepiride (AMARYL) 4 MG tablet Take 4 mg by mouth at bedtime.  10/21/18   [provider]  losartan-hydrochlorothiazide (HYZAAR) 100-12.5 MG tablet Take 1 tablet by mouth daily.    [provider]  metFORMIN (GLUCOPHAGE-XR) 500 MG 24 hr tablet Take 1,000 mg by mouth 2 (two) times daily. 10/23/18   [provider]  metoprolol tartrate (LOPRESSOR) 50 MG tablet Take 50 mg by mouth 2 (two) times daily. 10/21/18   [provider]  rosuvastatin (CRESTOR) 10 MG tablet TAKE 1 TABLET BY MOUTH EVERY DAY 05/04/20   Pricilla Riffle, MD    Physical Exam: Vitals:   06/01/20 1749 06/01/20 1750 06/01/20 1751 06/01/20 1822  BP:    (!) 163/84  Pulse: 82 85 82 78  Resp: 17 16 16 16   Temp:      TempSrc:      SpO2: 94% 95% 94% 99%    Vitals:   06/01/20 1749 06/01/20 1750 06/01/20 1751 06/01/20 1822  BP:    (!) 163/84  Pulse: 82 85 82 78  Resp: 17 16 16 16    Temp:      TempSrc:      SpO2: 94% 95% 94% 99%   General: Not in pain or dyspnea  Neurology: Awake and alert, non focal Head and Neck. Head normocephalic. Neck supple with no adenopathy or thyromegaly.   E ENT: no pallor, no icterus, oral mucosa moist Cardiovascular: No JVD. S1-S2 present, rhythmic, no gallops, rubs, or murmurs. No lower extremity edema. Pulmonary: vesicular breath sounds bilaterally, adequate air movement, no wheezing, rhonchi or rales. Gastrointestinal. Abdomen soft and non tender Skin. No rashes Musculoskeletal: no joint deformities    Labs on Admission: I have personally reviewed following labs and imaging studies  CBC: Recent Labs  Lab 06/01/20 1706 06/01/20 1728  WBC 6.9  --   NEUTROABS 5.2  --   HGB 13.7 14.3  HCT 43.0 42.0  MCV 90.9  --   PLT 183  --    Basic Metabolic Panel: Recent Labs  Lab 06/01/20 1706 06/01/20 1728  NA 135 140  K 3.4* 3.5  CL 105 105  CO2 21*  --   GLUCOSE 111* 104*  BUN 22 22  CREATININE 1.89* 1.90*  CALCIUM 8.6*  --    GFR: CrCl cannot be calculated (Unknown ideal weight.). Liver Function Tests: Recent Labs  Lab 06/01/20 1706  AST 21  ALT 25  ALKPHOS 39  BILITOT 0.7  PROT 6.6  ALBUMIN 3.9   No results for input(s): LIPASE, AMYLASE in the last 168 hours. No results for input(s): AMMONIA in the last 168 hours. Coagulation Profile: Recent Labs  Lab 06/01/20 1706  INR 1.1   Cardiac Enzymes: No results for input(s): CKTOTAL, CKMB, CKMBINDEX, TROPONINI in the last 168 hours. BNP (last 3 results) No results for input(s): PROBNP in the last 8760 hours. HbA1C: No results for input(s): HGBA1C in the last 72 hours. CBG: Recent Labs  Lab 06/01/20 1703  GLUCAP 110*   Lipid Profile: No results for input(s): CHOL, HDL, LDLCALC, TRIG, CHOLHDL, LDLDIRECT in the last 72 hours. Thyroid Function Tests: No results for input(s): TSH, T4TOTAL, FREET4, T3FREE, THYROIDAB in the last 72 hours. Anemia Panel: No  results for input(s): VITAMINB12, FOLATE, FERRITIN, TIBC, IRON, RETICCTPCT in the last 72 hours. Urine analysis: No results found for: COLORURINE, APPEARANCEUR, LABSPEC, PHURINE, GLUCOSEU, HGBUR, BILIRUBINUR, KETONESUR, PROTEINUR, UROBILINOGEN, NITRITE, LEUKOCYTESUR  Radiological Exams on Admission: MR ANGIO HEAD WO CONTRAST  Result Date: 06/01/2020 CLINICAL DATA:  Right-sided numbness today. Symptoms largely resolved. EXAM: MRI HEAD WITHOUT CONTRAST MRA HEAD WITHOUT CONTRAST TECHNIQUE: Multiplanar, multiecho pulse sequences of the brain and surrounding structures were obtained without intravenous contrast. Angiographic images of the head were obtained using MRA technique without contrast. COMPARISON:  Head CT earlier same day. FINDINGS: MRI HEAD FINDINGS Brain: Diffusion imaging shows 2 small areas of acute infarction at the right posterior frontal lobe affecting the cortical gray matter. No  large confluent infarction. The brainstem and cerebellum are normal. Cerebral hemispheres otherwise show only a few punctate foci of T2 and FLAIR signal of the white matter. No mass, hemorrhage, hydrocephalus or extra-axial collection. Vascular: Major vessels at the base of the brain show flow. Skull and upper cervical spine: Negative Sinuses/Orbits: Clear/normal Other: None MRA HEAD FINDINGS Both internal carotid arteries are widely patent through the skull base and siphon regions. The anterior and middle cerebral vessels are patent without proximal stenosis, aneurysm or vascular malformation. Both vertebral arteries are patent to the basilar. No basilar stenosis. Posterior circulation branch vessels show flow. Right PCA vessels seen more distal on the right than the left. 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 No large confluent infarction. No large or medium vessel occlusion or  correctable proximal stenosis. Electronically Signed   By: Paulina FusiMark  Shogry M.D.   On: 06/01/2020 18:23   MR Brain Wo Contrast (neuro protocol)  Result Date: 06/01/2020 CLINICAL DATA:  Right-sided numbness today. Symptoms largely resolved. EXAM: MRI HEAD WITHOUT CONTRAST MRA HEAD WITHOUT CONTRAST TECHNIQUE: Multiplanar, multiecho pulse sequences of the brain and surrounding structures were obtained without intravenous contrast. Angiographic images of the head were obtained using MRA technique without contrast. COMPARISON:  Head CT earlier same day. FINDINGS: MRI HEAD FINDINGS Brain: Diffusion imaging shows 2 small areas of acute infarction at the right posterior frontal lobe affecting the cortical gray matter. No large confluent infarction. The brainstem and cerebellum are normal. Cerebral hemispheres otherwise show only a few punctate foci of T2 and FLAIR signal of the white matter. No mass, hemorrhage, hydrocephalus or extra-axial collection. Vascular: Major vessels at the base of the brain show flow. Skull and upper cervical spine: Negative Sinuses/Orbits: Clear/normal Other: None MRA HEAD FINDINGS Both internal carotid arteries are widely patent through the skull base and siphon regions. The anterior and middle cerebral vessels are patent without proximal stenosis, aneurysm or vascular malformation. Both vertebral arteries are patent to the basilar. No basilar stenosis. Posterior circulation branch vessels show flow. Right PCA vessels seen more distal on the right than the left. 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 No large confluent infarction. No large or medium vessel occlusion or correctable proximal stenosis. Electronically Signed   By: Paulina FusiMark  Shogry M.D.   On: 06/01/2020 18:23   CT HEAD CODE STROKE WO CONTRAST  Result Date: 06/01/2020 CLINICAL DATA:  Code stroke.  Right  arm numbness EXAM: CT HEAD WITHOUT CONTRAST TECHNIQUE: Contiguous axial images were obtained from the base of the skull through the vertex without intravenous contrast. COMPARISON:  None. FINDINGS: Brain: Normal appearance without evidence of atrophy, old or acute infarction, mass lesion, hemorrhage, hydrocephalus or extra-axial collection. Vascular: There is atherosclerotic calcification of the major vessels at the base of the brain. Skull: Negative Sinuses/Orbits: Minimal ethmoid sinus opacification. Orbits negative. Other: None ASPECTS (Alberta Stroke Program Early CT Score) - Ganglionic level infarction (caudate, lentiform nuclei, internal capsule, insula, M1-M3 cortex): 7 - Supraganglionic infarction (M4-M6 cortex): 3 Total score (0-10 with 10 being normal): 10 IMPRESSION: 1. Normal head CT for age. 2. ASPECTS is 10. 3. These results were called by telephone at the time of interpretation on 06/01/2020 at 5:19 pm to provider Vanetta MuldersSCOTT ZACKOWSKI , who verbally acknowledged these results. Electronically Signed   By: Loraine LericheMark  Shogry M.D.   On: 06/01/2020 17:20    EKG: Independently reviewed.  80 bpm, normal axis, normal intervals, sinus rhythm, no ST segment or T wave changes.  Assessment/Plan Active Problems:   CVA (cerebral vascular accident) (HCC)   Essential hypertension   CAD (coronary artery disease)   Dyslipidemia   T2DM (type 2 diabetes mellitus) (HCC)   CKD (chronic kidney disease), stage III (HCC)   65 year old male with diffuse atherosclerotic vascular disease, coronary attery disease, peripheral vascular disease, dyslipidemia, type II diabetes mellitus and hypertension.  He suffered a acute focal neurologic deficit affecting his right arm and right leg, lasted for about 35 to 40 minutes.  By the time of my exam his nonfocal. On his initial physical examination blood pressure 134/76, heart rate 81, respiratory rate 16, oxygen saturation 99% on room air. His lungs are clear to auscultation  bilaterally, heart S1-S2 present rhythmic, abdomen soft, lower extremity edema.  Neurologically patient nonfocal.  Sodium 135, potassium 3.4, chloride 105, bicarb 21, glucose 111, BUN 22, creatinine 1.89, white count 6.9, hemoglobin 13.7, hematocrit 43.0, platelets 183. SARS COVID-19 negative. Urinalysis specific reactive 1.014, 0-5 white cells Drug screen negative.  Head CT negative for acute changes. Brain MRI/ MRA with 2 small acute infarctions in the right posterior frontal cortical gray matter, consistent with embolic disease in the right middle cerebral artery territory. No large or medium vessel occlusion.  Mr. Buczynski will be admitted to the hospital with the working diagnosis of acute CVA/TIA  1.  Acute CVA/TIA.  Patient has recovered from his focal neurologic deficit.  Telemetry monitoring, further work-up with echocardiography, lipid profile, hemoglobin A1c.   Per neurology recommendations:  Ticagrelor load 180 mg daily with aspirin 325 mg daily followed by aspirin 81 mg daily and ticagrelor 90 mg twice daily for 30 days then aspirin alone. Follow-up with PT and OT recommendations.  2.  Hypertension.  Continue blood pressure control with amlodipine and metoprolol.  3.  Coronary artery disease.  Patient is chest pain-free, continue aspirin and rosuvastatin.  Now on ticagrelor.  4.  Dyslipidemia.  Continue ezetimibe and atorvastatin.  5.  Type 2 diabetes mellitus.  Continue glucose coverage and monitoring with insulin sliding scale.  6.  Acute kidney injury on CKD stage 3b.  Renal function with serum creatinine 1.90, baseline around 1.4, follow-up kidney function in the morning, clinically patient seems euvolemic.  Status is: Inpatient  Remains inpatient appropriate because:Inpatient level of care appropriate due to severity of illness   Dispo: The patient is from: Home              Anticipated d/c is to: Home              Anticipated d/c date is: 2 days               Patient currently is not medically stable to d/c.   Difficult to place patient No    DVT prophylaxis: Enoxaparin   Code Status:   full  Family Communication:  I spoke with patient's wife at the bedside, we talked in detail about patient's condition, plan of care and prognosis and all questions were addressed.     Consults called:  Neurology   Admission status:  Inpatient    Mauricio Annett Gula MD [facility]   06/01/2020, 6:46 PM

## 2020-06-01 NOTE — ED Notes (Signed)
Pt taken to CT by Iona Coach, RN.

## 2020-06-01 NOTE — ED Provider Notes (Signed)
[facility] EMERGENCY DEPARTMENT Provider Note   CSN: [id number] Arrival date & time: 06/01/20  1647  An emergency department physician performed an initial assessment on this suspected stroke patient at 45.  History Chief Complaint  Patient presents with  . Extremity Weakness    THANE AGE is a 65 y.o. male.  Patient brought in by POV.  Patient was at [employer] at around 3 PM this afternoon suddenly got numbness and weakness in his right upper extremity and right lower extremity.  Says it felt like rubber.  No visual changes no speech problems.  No facial changes.  Patient's past medical history is significant for coronary artery disease with stents and diabetes hyperlipidemia and hypertension.  No prior history of any strokes.  Patient states that all symptoms now have resolved.  But patient within the TPA window so code stroke activated.  Although and less symptoms get worse he would not receive TPA        Past Medical History:  Diagnosis Date  . Coronary artery disease    pt reports he has stents  . Diabetes mellitus without complication (HCC)   . Hyperlipidemia   . Hypertension     Patient Active Problem List   Diagnosis Date Noted  . CVA (cerebral vascular accident) (HCC) 06/01/2020    Past Surgical History:  Procedure Laterality Date  . CARDIAC SURGERY         Family History  Problem Relation Age of Onset  . Alzheimer's disease Mother   . Heart attack Father   . Hypertension Brother   . CAD Brother     Social History   Tobacco Use  . Smoking status: Former Smoker    Quit date: 11/24/2017    Years since quitting: 2.5  . Smokeless tobacco: Never Used  Vaping Use  . Vaping Use: Never used  Substance Use Topics  . Alcohol use: Not Currently  . Drug use: Never    Home Medications Prior to Admission medications   Medication Sig Start Date End Date Taking? Authorizing Provider  amLODipine (NORVASC) 10 MG tablet TAKE ONE-HALF (5MG ) TABLET  BY MOUTH TWICE A DAY 02/22/20   13/9/21, MD  amLODipine (NORVASC) 5 MG tablet Take 1 tablet (5 mg total) by mouth 2 (two) times daily. 05/24/19 08/22/19  10/22/19, MD  atorvastatin (LIPITOR) 40 MG tablet Take by mouth. 12/17/18   [provider]  clopidogrel (PLAVIX) 75 MG tablet Take 75 mg by mouth at bedtime.  10/21/18   [provider]  ezetimibe (ZETIA) 10 MG tablet Take 1 tablet (10 mg total) by mouth daily. 04/18/20 07/17/20  09/16/20, MD  glimepiride (AMARYL) 4 MG tablet Take 4 mg by mouth at bedtime.  10/21/18   [provider]  losartan-hydrochlorothiazide (HYZAAR) 100-12.5 MG tablet Take 1 tablet by mouth daily.    [provider]  metFORMIN (GLUCOPHAGE-XR) 500 MG 24 hr tablet Take 1,000 mg by mouth 2 (two) times daily. 10/23/18   [provider]  metoprolol tartrate (LOPRESSOR) 50 MG tablet Take 50 mg by mouth 2 (two) times daily. 10/21/18   [provider]  rosuvastatin (CRESTOR) 10 MG tablet TAKE 1 TABLET BY MOUTH EVERY DAY 05/04/20   05/06/20, MD    Allergies    Patient has no known allergies.  Review of Systems   Review of Systems  Constitutional: Negative for chills and fever.  HENT: Negative for rhinorrhea and sore throat.  Eyes: Negative for visual disturbance.  Respiratory: Negative for cough and shortness of breath.   Cardiovascular: Negative for chest pain and leg swelling.  Gastrointestinal: Negative for abdominal pain, diarrhea, nausea and vomiting.  Genitourinary: Negative for dysuria.  Musculoskeletal: Negative for back pain and neck pain.  Skin: Negative for rash.  Neurological: Positive for weakness and numbness. Negative for dizziness, light-headedness and headaches.  Hematological: Does not bruise/bleed easily.  Psychiatric/Behavioral: Negative for confusion.    Physical Exam Updated Vital Signs BP 134/76   Pulse 81   Temp 97.7 F (36.5 C) (Oral)   Resp 16   SpO2 96%   Physical Exam Vitals  and nursing note reviewed.  Constitutional:      Appearance: Normal appearance. He is well-developed and well-nourished.  HENT:     Head: Normocephalic and atraumatic.  Eyes:     Extraocular Movements: Extraocular movements intact.     Conjunctiva/sclera: Conjunctivae normal.     Pupils: Pupils are equal, round, and reactive to light.  Cardiovascular:     Rate and Rhythm: Normal rate and regular rhythm.     Heart sounds: No murmur heard.   Pulmonary:     Effort: Pulmonary effort is normal. No respiratory distress.     Breath sounds: Normal breath sounds.  Abdominal:     Palpations: Abdomen is soft.     Tenderness: There is no abdominal tenderness.  Musculoskeletal:        General: No swelling, deformity or edema. Normal range of motion.     Cervical back: Normal range of motion and neck supple.  Skin:    General: Skin is warm and dry.     Capillary Refill: Capillary refill takes less than 2 seconds.  Neurological:     General: No focal deficit present.     Mental Status: He is alert and oriented to person, place, and time.     Cranial Nerves: No cranial nerve deficit.     Sensory: No sensory deficit.     Motor: No weakness.     Coordination: Coordination normal.  Psychiatric:        Mood and Affect: Mood and affect normal.     ED Results / Procedures / Treatments   Labs (all labs ordered are listed, but only abnormal results are displayed) Labs Reviewed  CBC - Abnormal; Notable for the following components:      Result Value   RDW 15.8 (*)    All other components within normal limits  COMPREHENSIVE METABOLIC PANEL - Abnormal; Notable for the following components:   Potassium 3.4 (*)    CO2 21 (*)    Glucose, Bld 111 (*)    Creatinine, Ser 1.89 (*)    Calcium 8.6 (*)    GFR, Estimated 39 (*)    All other components within normal limits  CBG MONITORING, ED - Abnormal; Notable for the following components:   Glucose-Capillary 110 (*)    All other components within  normal limits  I-STAT CHEM 8, ED - Abnormal; Notable for the following components:   Creatinine, Ser 1.90 (*)    Glucose, Bld 104 (*)    All other components within normal limits  RESP PANEL BY RT-PCR (FLU A&B, COVID) ARPGX2  SARS CORONAVIRUS 2 (TAT 6-24 HRS)  ETHANOL  PROTIME-INR  APTT  DIFFERENTIAL  RAPID URINE DRUG SCREEN, HOSP PERFORMED  URINALYSIS, ROUTINE W REFLEX MICROSCOPIC  HIV ANTIBODY (ROUTINE TESTING W REFLEX)  CBC  CREATININE, SERUM    EKG EKG Interpretation  Date/Time:  Thursday June 01 2020 17:07:48 EST Ventricular Rate:  80 PR Interval:    QRS Duration: 111 QT Interval:  382 QTC Calculation: 441 R Axis:   22 Text Interpretation: Sinus rhythm Low voltage, precordial leads Confirmed by Vanetta Mulders [phone]) on 06/01/2020 5:15:01 PM   Radiology MR ANGIO HEAD WO CONTRAST  Result Date: 06/01/2020 CLINICAL DATA:  Right-sided numbness today. Symptoms largely resolved. EXAM: MRI HEAD WITHOUT CONTRAST MRA HEAD WITHOUT CONTRAST TECHNIQUE: Multiplanar, multiecho pulse sequences of the brain and surrounding structures were obtained without intravenous contrast. Angiographic images of the head were obtained using MRA technique without contrast. COMPARISON:  Head CT earlier same day. FINDINGS: MRI HEAD FINDINGS Brain: Diffusion imaging shows 2 small areas of acute infarction at the right posterior frontal lobe affecting the cortical gray matter. No large confluent infarction. The brainstem and cerebellum are normal. Cerebral hemispheres otherwise show only a few punctate foci of T2 and FLAIR signal of the white matter. No mass, hemorrhage, hydrocephalus or extra-axial collection. Vascular: Major vessels at the base of the brain show flow. Skull and upper cervical spine: Negative Sinuses/Orbits: Clear/normal Other: None MRA HEAD FINDINGS Both internal carotid arteries are widely patent through the skull base and siphon regions. The anterior and middle cerebral vessels are  patent without proximal stenosis, aneurysm or vascular malformation. Both vertebral arteries are patent to the basilar. No basilar stenosis. Posterior circulation branch vessels show flow. Right PCA vessels seen more distal on the right than the left. 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 No large confluent infarction. No large or medium vessel occlusion or correctable proximal stenosis. Electronically Signed   By: Paulina Fusi M.D.   On: 06/01/2020 18:23   MR Brain Wo Contrast (neuro protocol)  Result Date: 06/01/2020 CLINICAL DATA:  Right-sided numbness today. Symptoms largely resolved. EXAM: MRI HEAD WITHOUT CONTRAST MRA HEAD WITHOUT CONTRAST TECHNIQUE: Multiplanar, multiecho pulse sequences of the brain and surrounding structures were obtained without intravenous contrast. Angiographic images of the head were obtained using MRA technique without contrast. COMPARISON:  Head CT earlier same day. FINDINGS: MRI HEAD FINDINGS Brain: Diffusion imaging shows 2 small areas of acute infarction at the right posterior frontal lobe affecting the cortical gray matter. No large confluent infarction. The brainstem and cerebellum are normal. Cerebral hemispheres otherwise show only a few punctate foci of T2 and FLAIR signal of the white matter. No mass, hemorrhage, hydrocephalus or extra-axial collection. Vascular: Major vessels at the base of the brain show flow. Skull and upper cervical spine: Negative Sinuses/Orbits: Clear/normal Other: None MRA HEAD FINDINGS Both internal carotid arteries are widely patent through the skull base and siphon regions. The anterior and middle cerebral vessels are patent without proximal stenosis, aneurysm or vascular malformation. Both vertebral arteries are patent to the basilar. No basilar stenosis. Posterior circulation branch vessels show flow. Right PCA  vessels seen more distal on the right than the left. 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 No large confluent infarction. No large or medium vessel occlusion or correctable proximal stenosis. Electronically Signed   By: Paulina Fusi M.D.   On: 06/01/2020 18:23   CT HEAD CODE STROKE WO CONTRAST  Result Date: 06/01/2020 CLINICAL DATA:  Code stroke.  Right arm numbness EXAM: CT HEAD WITHOUT CONTRAST TECHNIQUE: Contiguous axial images were  obtained from the base of the skull through the vertex without intravenous contrast. COMPARISON:  None. FINDINGS: Brain: Normal appearance without evidence of atrophy, old or acute infarction, mass lesion, hemorrhage, hydrocephalus or extra-axial collection. Vascular: There is atherosclerotic calcification of the major vessels at the base of the brain. Skull: Negative Sinuses/Orbits: Minimal ethmoid sinus opacification. Orbits negative. Other: None ASPECTS (Alberta Stroke Program Early CT Score) - Ganglionic level infarction (caudate, lentiform nuclei, internal capsule, insula, M1-M3 cortex): 7 - Supraganglionic infarction (M4-M6 cortex): 3 Total score (0-10 with 10 being normal): 10 IMPRESSION: 1. Normal head CT for age. 2. ASPECTS is 10. 3. These results were called by telephone at the time of interpretation on 06/01/2020 at 5:19 pm to provider Vanetta Mulders , who verbally acknowledged these results. Electronically Signed   By: Paulina Fusi M.D.   On: 06/01/2020 17:20    Procedures Procedures CRITICAL CARE Performed by: Vanetta Mulders Total critical care time: 45 minutes Critical care time was exclusive of separately billable procedures and treating other patients. Critical care was necessary to treat or prevent imminent or life-threatening deterioration. Critical care was time spent personally by me on the following activities:  development of treatment plan with patient and/or surrogate as well as nursing, discussions with consultants, evaluation of patient's response to treatment, examination of patient, obtaining history from patient or surrogate, ordering and performing treatments and interventions, ordering and review of laboratory studies, ordering and review of radiographic studies, pulse oximetry and re-evaluation of patient's condition.   Medications Ordered in ED Medications  ticagrelor (BRILINTA) tablet 90 mg (has no administration in time range)  aspirin EC tablet 81 mg (has no administration in time range)  amLODipine (NORVASC) tablet 5 mg (has no administration in time range)  atorvastatin (LIPITOR) tablet 40 mg (has no administration in time range)  ezetimibe (ZETIA) tablet 10 mg (has no administration in time range)  losartan-hydrochlorothiazide (HYZAAR) 100-12.5 MG per tablet 1 tablet (has no administration in time range)  metoprolol tartrate (LOPRESSOR) tablet 50 mg (has no administration in time range)  rosuvastatin (CRESTOR) tablet 10 mg (has no administration in time range)  clopidogrel (PLAVIX) tablet 75 mg (has no administration in time range)  enoxaparin (LOVENOX) injection 40 mg (has no administration in time range)  acetaminophen (TYLENOL) tablet 650 mg (has no administration in time range)    Or  acetaminophen (TYLENOL) suppository 650 mg (has no administration in time range)  ondansetron (ZOFRAN) tablet 4 mg (has no administration in time range)    Or  ondansetron (ZOFRAN) injection 4 mg (has no administration in time range)  ticagrelor (BRILINTA) tablet 180 mg (180 mg Oral Given 06/01/20 1841)  aspirin tablet 325 mg (325 mg Oral Given 06/01/20 1841)    ED Course  I have reviewed the triage vital signs and the nursing notes.  Pertinent labs & imaging results that were available during my care of the patient were reviewed by me and considered in my medical decision making (see chart for  details).    MDM Rules/Calculators/A&P                         Patient evaluated by Dr. Pearlean Brownie from the stroke team.  Feels it is TIA.  Recommends admission.  Had put in some prescriptions for medications for him.  As well as aspirin.  I notified him that we could still get MRI.  So that was done and they notified us during that that they saw  evidence of a stroke so patient received MRA.  Patient is receiving findings of acute changes on the MRA consistent with acute stroke.  But his neuro exam is completely back to normal.  Discussed with hospitalist they will admit.  Will need Doppler studies done.  Dr. Gerilyn Pilgrimoonquah can see the patient tomorrow. Patient started on Brilinta as prescribed by Dr. Pearlean BrownieSethi.  Patient asymptomatic but Covid testing has been ordered for admission.  Patient has had 2 of the vaccines.  Has not had the booster  Final Clinical Impression(s) / ED Diagnoses Final diagnoses:  TIA (transient ischemic attack)    Rx / DC Orders ED Discharge Orders    None       Vanetta MuldersZackowski, Olayinka Gathers, MD 06/01/20 [id number]

## 2020-06-01 NOTE — Consult Note (Addendum)
Virtual Visit via Video Note  I connected with Kyle Marks on @TODAY @ at  by a video enabled telemedicine application and verified that I am speaking with the correct person using two identifiers.  Location: Patient: at [facility] Ed room 5 Provider: [facility] Facilitators Dutch Quint atrium nurse and Diannia Ruder bedside RN   I discussed the limitations of evaluation and management by telemedicine and the availability of in person appointments. The patient expressed understanding and agreed to proceed.  History of Present Illness: Kyle Marks is a 65 year old Caucasian male with past medical history of hypertension, diabetes, peripheral vascular disease status post iliac stent, hyperlipidemia and mild obesity who developed sudden onset of numbness and weakness today while out shopping at 1500 hrs.  This mostly involves his right upper extremity.  He felt his arm was rubbery and had good control and does weak.  He was seen by passerby there who gave him orange juice and he started feeling better after that.  At the time he reached the emergency room his symptoms have resolved completely and lasted a total of less than 1 hour.  Patient states he is back to his baseline. He denies any accompanying headache, slurred speech, vision loss, gait or balance problems.  He denies prior history of strokes or TIAs.  He states he takes Plavix and has been compliant with it.  His CBG was 110 mg percent.  Heart rate was 86.  Blood pressure was 179/91. CT scan of the head without contrast was personally reviewed in PACS and shows no acute abnormality. Admission labs are pending at this time. Last seen normal 1500 hrs. on 06/01/2020 IV TPA ; no as symptoms have completely resolved and NIH stroke scale is 0. Thrombectomy no as clinical exam not suggestive of LVO   Observations/Objective: Exam limited due to constraints of virtual video visit Pleasant 65 year old mildly obese Caucasian male not in distress.  Awake alert  oriented to time place and person.  Speech is clear without dysarthria or aphasia.  Follows commands well.  Extraocular movements full range without nystagmus.  Face symmetric without weakness.  Tongue midline.  Motor system exam symmetric upper and lower extremity strength without drift or focal weakness.  Finger-to-nose and needle coordination is accurate.  Touch pinprick sensation is symmetric bilaterally.  Gait not tested.  NIH stroke scale 0  Premorbid modified Rankin scale 0  Assessment and Plan: 65 year old Caucasian male with transient right sided weakness and numbness and incoordination lasting less than 1 hour likely due to left hemispheric TIA etiology likely small vessel disease.  Vascular risk factors of diabetes, hypertension, hyperlipidemia, obesity and peripheral vascular disease.   Follow Up Instructions: Patient needs to be admitted to medical service for observation and further stroke work-up.  Case discussed with Dr. 77 ED physician and plan of care communicated to him. Recommend admit with telemetry monitoring to the floor bed.  Check MRI scan of the brain with MRA of the brain and neck.   Check echocardiogram. Check lipid profile and hemoglobin A1c. RN to do bedside stroke swallow screen and if patient passes recommend loaded with Brilinta 180 mg and aspirin 325 mg x 1 followed by aspirin 81 mg daily and Brilinta 90 mg twice daily into 30 days and then aspirin alone. Physical and occupational therapy consults   I discussed the assessment and treatment plan with the patient. The patient was provided an opportunity to ask questions and all were answered. The patient agreed with the plan and demonstrated an  understanding of the instructions.   The patient was advised to call back or seek an in-person evaluation if the symptoms worsen or if the condition fails to improve as anticipated.   This patient is critically ill and at significant risk of neurological worsening,  death and care requires constant monitoring of vital signs, hemodynamics,respiratory and cardiac monitoring, extensive review of multiple databases, frequent neurological assessment, discussion with family, other specialists and medical decision making of high complexity.I have made any additions or clarifications directly to the above note.This critical care time does not reflect procedure time, or teaching time or supervisory time of PA/NP/Med Resident etc but could involve care discussion time.  I spent 35 minutes of neurocritical care time  in the care of  this patient.      Delia Heady, MD

## 2020-06-02 ENCOUNTER — Inpatient Hospital Stay (HOSPITAL_BASED_OUTPATIENT_CLINIC_OR_DEPARTMENT_OTHER): Payer: Medicare Other

## 2020-06-02 ENCOUNTER — Inpatient Hospital Stay (HOSPITAL_COMMUNITY): Payer: Medicare Other

## 2020-06-02 DIAGNOSIS — I63411 Cerebral infarction due to embolism of right middle cerebral artery: Secondary | ICD-10-CM

## 2020-06-02 DIAGNOSIS — I10 Essential (primary) hypertension: Secondary | ICD-10-CM | POA: Diagnosis not present

## 2020-06-02 DIAGNOSIS — E1122 Type 2 diabetes mellitus with diabetic chronic kidney disease: Secondary | ICD-10-CM

## 2020-06-02 DIAGNOSIS — I63131 Cerebral infarction due to embolism of right carotid artery: Secondary | ICD-10-CM | POA: Diagnosis not present

## 2020-06-02 DIAGNOSIS — Z6834 Body mass index (BMI) 34.0-34.9, adult: Secondary | ICD-10-CM

## 2020-06-02 DIAGNOSIS — G459 Transient cerebral ischemic attack, unspecified: Secondary | ICD-10-CM

## 2020-06-02 DIAGNOSIS — I2510 Atherosclerotic heart disease of native coronary artery without angina pectoris: Secondary | ICD-10-CM

## 2020-06-02 DIAGNOSIS — E785 Hyperlipidemia, unspecified: Secondary | ICD-10-CM

## 2020-06-02 DIAGNOSIS — N1831 Chronic kidney disease, stage 3a: Secondary | ICD-10-CM

## 2020-06-02 DIAGNOSIS — E6609 Other obesity due to excess calories: Secondary | ICD-10-CM

## 2020-06-02 LAB — HEMOGLOBIN A1C
Hgb A1c MFr Bld: 6.6 % — ABNORMAL HIGH (ref 4.8–5.6)
Mean Plasma Glucose: 142.72 mg/dL

## 2020-06-02 LAB — BASIC METABOLIC PANEL
Anion gap: 9 (ref 5–15)
BUN: 17 mg/dL (ref 8–23)
CO2: 22 mmol/L (ref 22–32)
Calcium: 8.8 mg/dL — ABNORMAL LOW (ref 8.9–10.3)
Chloride: 108 mmol/L (ref 98–111)
Creatinine, Ser: 1.35 mg/dL — ABNORMAL HIGH (ref 0.61–1.24)
GFR, Estimated: 59 mL/min — ABNORMAL LOW (ref >60)
Glucose, Bld: 100 mg/dL — ABNORMAL HIGH (ref 70–99)
Potassium: 3.4 mmol/L — ABNORMAL LOW (ref 3.5–5.1)
Sodium: 139 mmol/L (ref 135–145)

## 2020-06-02 LAB — LIPID PANEL
Cholesterol: 83 mg/dL (ref 0–200)
HDL: 29 mg/dL — ABNORMAL LOW (ref >40)
LDL Cholesterol: 30 mg/dL (ref 0–99)
Total CHOL/HDL Ratio: 2.9 RATIO
Triglycerides: 118 mg/dL (ref <150)
VLDL: 24 mg/dL (ref 0–40)

## 2020-06-02 LAB — ECHOCARDIOGRAM COMPLETE
Area-P 1/2: 2.26 cm2
Height: 71 in
S' Lateral: 3.07 cm
Weight: 3961.6 oz

## 2020-06-02 LAB — GLUCOSE, CAPILLARY
Glucose-Capillary: 108 mg/dL — ABNORMAL HIGH (ref 70–99)
Glucose-Capillary: 178 mg/dL — ABNORMAL HIGH (ref 70–99)
Glucose-Capillary: 136 mg/dL — ABNORMAL HIGH (ref 70–99)

## 2020-06-02 LAB — HIV ANTIBODY (ROUTINE TESTING W REFLEX): HIV Screen 4th Generation wRfx: NONREACTIVE

## 2020-06-02 MED ORDER — OMEGA-3-ACID ETHYL ESTERS 1 G PO CAPS: 1.0000 g | ORAL_CAPSULE | Freq: Two times a day (BID) | ORAL | 2 refills | Status: AC

## 2020-06-02 MED ORDER — OMEGA-3-ACID ETHYL ESTERS 1 G PO CAPS
1.0000 g | ORAL_CAPSULE | Freq: Two times a day (BID) | ORAL | Status: DC
  Administered 2020-06-02: 1 g via ORAL
  Filled 2020-06-02: qty 1

## 2020-06-02 MED ORDER — TICAGRELOR 90 MG PO TABS: 90.0000 mg | ORAL_TABLET | Freq: Two times a day (BID) | ORAL | 0 refills | Status: AC

## 2020-06-02 MED ORDER — AMLODIPINE BESYLATE 10 MG PO TABS: 10.0000 mg | ORAL_TABLET | Freq: Every day | ORAL | 2 refills | Status: AC

## 2020-06-02 MED ORDER — ASPIRIN 81 MG PO TBEC: 81.0000 mg | DELAYED_RELEASE_TABLET | Freq: Every day | ORAL | 11 refills | Status: AC

## 2020-06-02 NOTE — Progress Notes (Signed)
Patient discharged home with instructions, educated on stroke s/s and prophylaxis. Medications sent to pharmacy , removed IV from LFA. Patient escorted safely to main entrance to wait for his ride.

## 2020-06-02 NOTE — Progress Notes (Signed)
*  PRELIMINARY RESULTS* Echocardiogram 2D Echocardiogram has been performed.  Jeryl Columbia 06/02/2020, 2:23 PM

## 2020-06-02 NOTE — Progress Notes (Signed)
Brief Neuro Update:  Stroke workup is mostly completed. MRI Brain demonstrates 2 cortical infarcts in the R MCA territory. These infarcts are concerning for an emoblic phenomena. Vessel imaging with MR Angio head and Carotid duplex is negative for any significant stenosis or LVO. LDL is 30 and HbA1c is 6.6 for which he should follow up with his PCP. His TTE is still completed but report is still pending.  If the TTE is non revealing, recommend a Trans Esophageal Echo and placement of a Loop recorder(preferred over 30 day cardiac monitoring). Given that it is weekend now and on discussion with the primary team, Mr. Brizzi rightfully wants to go home, we are okay if this is done outpatient as long as this is done within a week of discharge and he follows up with Dr. Gerilyn Pilgrim with Neurology. Agree with Aspirin 81mg  daily and Brilinta 90mg  BID for 30 days, then Aspirin 81mg  daily alone.  [facility] Pager Number 

## 2020-06-02 NOTE — Care Management Obs Status (Signed)
MEDICARE OBSERVATION STATUS NOTIFICATION   Patient Details  Name: Kyle Marks MRN: [id number] Date of Birth: 1956-03-09   Medicare Observation Status Notification Given:  Yes    Elliot Gault, LCSW 06/02/2020, 2:18 PM

## 2020-06-02 NOTE — Discharge Summary (Signed)
Physician Discharge Summary  Kyle Marks AGT:[id number] DOB: 11-13-1955 DOA: 06/01/2020  PCP: Salome Holmes, MD  Admit date: 06/01/2020 Discharge date: 06/02/2020  Time spent: 30 minutes  Recommendations for Outpatient Follow-up:  1. Reassess blood pressure and adjust antihypertensive regimen as needed 2. Repeat basic metabolic panel to evaluate lites and renal function 3. Patient will need outpatient follow-up with cardiology for cardiac monitoring/loop recorder implantation and TEE. 4. Follow-up with neurology in 4 weeks. 5. 30 days of dual antiplatelet therapy with instructions to continue just aspirin after that for secondary prevention.   Discharge Diagnoses:  Active Problems:   CVA (cerebral vascular accident) (HCC)   Essential hypertension   CAD (coronary artery disease)   Dyslipidemia   T2DM (type 2 diabetes mellitus) (HCC)   CKD (chronic kidney disease), stage III (HCC)   Class 1 obesity due to excess calories with body mass index (BMI) of 34.0 to 34.9 in adult   Discharge Condition: Stable and improved.  Discharged home with instruction to follow-up with cardiology and neurology as an outpatient.  CODE STATUS: Full code.  Diet recommendation: Low calorie diet, heart healthy modified carbohydrate.  Filed Weights   06/01/20 2025  Weight: 112.3 kg    History of present illness:  As per H&P written by Dr. Ella Jubilee on 06/01/2020 Kyle Marks is a 65 y.o. male with medical history significant of peripheral vascular disease, coronary artery disease, type 2 diabetes mellitus, hypertension dyslipidemia who presents with focal neurologic deficit. He was at a local grocery store when he suddenly experienced right lower and upper extremity numbness and weakness, predominantly on his lower extremity.  Moderate to severe intensity, to the point where he had difficulty ambulating, no improving or worsening factors.  No associated pain.  Started about 3:45 PM and  lasted for about 35 to 40 minutes.  His wife drove him to the ED by car.  By the time he reached the ED his symptoms had resolved.  Hospital Course:  1-acute ischemic CVA -Thankfully no neurologic residual deficits appreciated -Given complete resolution of his symptoms no TPA was provided -Continue risk factor modifications -Following neurology recommendation patient will require outpatient cardiac monitoring versus loop recorder and TEE. -Dual antiplatelet therapy for 30 days using aspirin and Brilinta has been recommended. -Patient will follow up with neurology in 4 weeks. -Appears to be embolic in nature and failure to Plavix.  2-hypertension -Stable and well-controlled -Continue home antihypertensive management. -Heart healthy diet has been encouraged.  3-dyslipidemia -Continue Setia and Crestor -Lovaza has been added to further assess increasing his HDL.  4-history of coronary artery disease -No chest pain, no shortness of breath -Continue treatment with metoprolol, aspirin and Crestor. -Patient has now been discharged on Brilinta.  5-type 2 diabetes mellitus -Resume home oral hypoglycemic agents -Patient advised to follow modified carbohydrate diet and maintain adequate hydration.  6-acute kidney injury on chronic kidney disease a stage IIIa -Improve with fluid resuscitation and holding nephrotoxic agents -Patient advised to maintain adequate hydration -Creatinine in the 1.3 range at discharge -Repeat basic metabolic panel follow-up visit to assess stability.  7-class II obesity -Low calorie diet, portion control increase physical activity discussed with patient. -Body mass index is 34.53 kg/m.  Procedures: See below for x-ray report 2D echo: No wall motion abnormality; preserved ejection fraction, no significant valvular disorder.  No thrombi appreciated on exam.  Consultations:  Neurology  Cardiology curbside, discussed with Dr. Theda Belfast  (to assist arranging  outpatient cardiac monitoring versus loop recorder  implantation and TEE)  Discharge Exam: Vitals:   06/02/20 1347 06/02/20 1352  BP: 134/73 135/78  Pulse: (!) 58 (!) 55  Resp:  17  Temp:  99.6 F (37.6 C)  SpO2:  96%    General: Afebrile, no chest pain, no nausea, no vomiting.  No pronation or motor deficit appreciated.  No facial droop or dysarthria seen.  Patient expressed no difficulty swallowing. Cardiovascular: Rate controlled, sinus rhythm appreciated on telemetry.  No JVD, no rubs or gallops. Respiratory: Good air movement bilaterally; no wheezing or crackles.  No using accessory muscle.  Normal saturation on room air. Abdomen: Obese, soft, nontender, nondistended, positive bowel sounds Extremities: No cyanosis or clubbing; no edema appreciated.  Discharge Instructions   Discharge Instructions    Diet - low sodium heart healthy   Complete by: As directed    Diet Carb Modified   Complete by: As directed    Discharge instructions   Complete by: As directed    Take medications as prescribed  Maintain adequate hydration Follow heart healthy, modified carbohydrates and low calorie diet. Outpatient follow-up with cardiology service in the next 10 days for cardiac monitoring versus loop recorder implantation and TEE (office will contact you with appointment details). -Outpatient follow-up in 4 weeks with Dr. Gerilyn Pilgrim (neurologist). For the next 30 days you will use aspirin and Brilinta; after that 81 mg of aspirin on daily basis for secondary prevention.   Increase activity slowly   Complete by: As directed      Allergies as of 06/02/2020   No Known Allergies     Medication List    STOP taking these medications   clopidogrel 75 MG tablet Commonly known as: PLAVIX     TAKE these medications   amLODipine 10 MG tablet Commonly known as: NORVASC Take 1 tablet (10 mg total) by mouth daily. What changed: See the new instructions.   aspirin 81 MG EC tablet Take 1  tablet (81 mg total) by mouth daily. Swallow whole. Start taking on: June 03, 2020   ezetimibe 10 MG tablet Commonly known as: ZETIA Take 1 tablet (10 mg total) by mouth daily.   glimepiride 4 MG tablet Commonly known as: AMARYL Take 4 mg by mouth at bedtime.   losartan-hydrochlorothiazide 100-12.5 MG tablet Commonly known as: HYZAAR Take 1 tablet by mouth daily.   metFORMIN 500 MG 24 hr tablet Commonly known as: GLUCOPHAGE-XR Take 1,000 mg by mouth 2 (two) times daily.   metoprolol tartrate 50 MG tablet Commonly known as: LOPRESSOR Take 50 mg by mouth 2 (two) times daily.   omega-3 acid ethyl esters 1 g capsule Commonly known as: LOVAZA Take 1 capsule (1 g total) by mouth 2 (two) times daily.   rosuvastatin 10 MG tablet Commonly known as: CRESTOR TAKE 1 TABLET BY MOUTH EVERY DAY   ticagrelor 90 MG Tabs tablet Commonly known as: BRILINTA Take 1 tablet (90 mg total) by mouth 2 (two) times daily.      No Known Allergies  Follow-up Information    Salome Holmes, MD. Schedule an appointment as soon as possible for a visit in 2 week(s).   Specialty: Family Medicine Contact information: [address] [place] [address] [phone]        Beryle Beams, MD. Schedule an appointment as soon as possible for a visit in 4 week(s).   Specialty: Neurology Contact information: [address] [place] [address] [phone]        Dietrich Pates  V, MD Follow up in 1 week(s).   Specialty: Cardiology Why: office will contact you with appointment details.  Contact information: [address] [place] [id number] [phone]               The results of significant diagnostics from this hospitalization (including imaging, microbiology, ancillary and laboratory) are listed below for reference.    Significant Diagnostic Studies: MR ANGIO HEAD WO CONTRAST  Result Date: 06/01/2020 CLINICAL DATA:  Right-sided numbness today. Symptoms  largely resolved. EXAM: MRI HEAD WITHOUT CONTRAST MRA HEAD WITHOUT CONTRAST TECHNIQUE: Multiplanar, multiecho pulse sequences of the brain and surrounding structures were obtained without intravenous contrast. Angiographic images of the head were obtained using MRA technique without contrast. COMPARISON:  Head CT earlier same day. FINDINGS: MRI HEAD FINDINGS Brain: Diffusion imaging shows 2 small areas of acute infarction at the right posterior frontal lobe affecting the cortical gray matter. No large confluent infarction. The brainstem and cerebellum are normal. Cerebral hemispheres otherwise show only a few punctate foci of T2 and FLAIR signal of the white matter. No mass, hemorrhage, hydrocephalus or extra-axial collection. Vascular: Major vessels at the base of the brain show flow. Skull and upper cervical spine: Negative Sinuses/Orbits: Clear/normal Other: None MRA HEAD FINDINGS Both internal carotid arteries are widely patent through the skull base and siphon regions. The anterior and middle cerebral vessels are patent without proximal stenosis, aneurysm or vascular malformation. Both vertebral arteries are patent to the basilar. No basilar stenosis. Posterior circulation branch vessels show flow. Right PCA vessels seen more distal on the right than the left. 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 No large confluent infarction. No large or medium vessel occlusion or correctable proximal stenosis. Electronically Signed   By: Paulina FusiMark  Shogry M.D.   On: 06/01/2020 18:23   MR Brain Wo Contrast (neuro protocol)  Result Date: 06/01/2020 CLINICAL DATA:  Right-sided numbness today. Symptoms largely resolved. EXAM: MRI HEAD WITHOUT CONTRAST MRA HEAD WITHOUT CONTRAST TECHNIQUE: Multiplanar, multiecho pulse sequences of the brain and surrounding structures were obtained without intravenous  contrast. Angiographic images of the head were obtained using MRA technique without contrast. COMPARISON:  Head CT earlier same day. FINDINGS: MRI HEAD FINDINGS Brain: Diffusion imaging shows 2 small areas of acute infarction at the right posterior frontal lobe affecting the cortical gray matter. No large confluent infarction. The brainstem and cerebellum are normal. Cerebral hemispheres otherwise show only a few punctate foci of T2 and FLAIR signal of the white matter. No mass, hemorrhage, hydrocephalus or extra-axial collection. Vascular: Major vessels at the base of the brain show flow. Skull and upper cervical spine: Negative Sinuses/Orbits: Clear/normal Other: None MRA HEAD FINDINGS Both internal carotid arteries are widely patent through the skull base and siphon regions. The anterior and middle cerebral vessels are patent without proximal stenosis, aneurysm or vascular malformation. Both vertebral arteries are patent to the basilar. No basilar stenosis. Posterior circulation branch vessels show flow. Right PCA vessels seen more distal on the right than the left. 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 No large confluent infarction. No large or medium vessel occlusion or correctable proximal stenosis. Electronically Signed   By: Paulina FusiMark  Shogry M.D.   On: 06/01/2020 18:23   US Carotid Bilateral  Result Date: 06/02/2020 CLINICAL DATA:  CVA Hypertension  Syncope CAD Hyperlipidemia Diabetes Prior tobacco use EXAM: BILATERAL CAROTID DUPLEX ULTRASOUND TECHNIQUE: Wallace Cullens scale imaging, color Doppler and duplex ultrasound were performed of bilateral carotid and vertebral arteries in the neck. COMPARISON:  None. FINDINGS: Criteria: Quantification of carotid stenosis is based on velocity parameters that correlate the residual internal carotid diameter with NASCET-based stenosis levels, using  the diameter of the distal internal carotid lumen as the denominator for stenosis measurement. The following velocity measurements were obtained: RIGHT ICA: 117/40 cm/sec CCA: 80/20 cm/sec SYSTOLIC ICA/CCA RATIO:  1.5 ECA: 165 cm/sec LEFT ICA: 91/28 cm/sec CCA: 74/18 cm/sec SYSTOLIC ICA/CCA RATIO:  1.2 ECA: 196 cm/sec RIGHT CAROTID ARTERY: Minimal scattered atherosclerotic plaque of the distal common and proximal internal carotid arteries without flow-limiting stenosis. RIGHT VERTEBRAL ARTERY:  Antegrade flow LEFT CAROTID ARTERY: No significant atherosclerotic disease of the left common or internal carotid artery. No significant stenosis. LEFT VERTEBRAL ARTERY:  Antegrade flow IMPRESSION: No significant narrowing of internal carotid arteries Electronically Signed   By: Acquanetta Belling M.D.   On: 06/02/2020 11:04   ECHOCARDIOGRAM COMPLETE  Result Date: 06/02/2020    ECHOCARDIOGRAM REPORT   Patient Name:   Kyle Marks Date of Exam: 06/02/2020 Medical Rec #:  [id number]         Height:       71.0 in Accession #:    [id number]        Weight:       247.6 lb Date of Birth:  [birth year]          BSA:          2.309 m Patient Age:    64 years          BP:           135/78 mmHg Patient Gender: M                 HR:           55 bpm. [facility] Procedure: 2D Echo Indications:    TIA 435.9 / G45.9  History:        Patient has no prior history of Echocardiogram examinations.                 CAD, Stroke; Risk Factors:Hypertension, Dyslipidemia, Former                 Smoker and Diabetes.  Sonographer:    Jeryl Columbia RDCS (AE) Referring Phys: [id number] York Ram ARRIEN IMPRESSIONS  1. Left ventricular ejection fraction, by estimation, is 60 to 65%. The left ventricle has normal function. The left ventricle has no regional wall motion abnormalities. There is mild left ventricular hypertrophy. Left ventricular diastolic parameters were normal.  2. Right ventricular systolic function is normal. The right  ventricular size is normal.  3. The mitral valve is normal in structure. No evidence of mitral valve regurgitation. No evidence of mitral stenosis.  4. The aortic valve is tricuspid. There is mild calcification of the aortic valve. Aortic valve regurgitation is not visualized. Mild to moderate aortic valve sclerosis/calcification is present, without any evidence of aortic stenosis.  5. The inferior vena cava is normal in size with greater than 50% respiratory variability, suggesting right atrial pressure of 3 mmHg. FINDINGS  Left Ventricle: Left ventricular ejection fraction, by estimation, is 60 to 65%. The left ventricle has normal function. The left ventricle has no regional wall motion abnormalities. The left ventricular internal cavity size was normal in size. There is  mild left ventricular hypertrophy. Left ventricular diastolic parameters were normal. Right Ventricle: The right ventricular size is normal. No increase in right ventricular wall thickness. Right ventricular systolic function is normal. Left Atrium: Left atrial size was normal in size. Right Atrium: Right atrial size was normal in size. Pericardium: There is no evidence of pericardial effusion. Mitral Valve: The mitral valve is normal in structure. No evidence of mitral valve regurgitation. No evidence of mitral valve stenosis. Tricuspid Valve: The tricuspid valve is normal in structure. Tricuspid valve regurgitation is not demonstrated. No evidence of tricuspid stenosis. Aortic Valve: The aortic valve is tricuspid. There is mild calcification of the aortic valve. Aortic valve regurgitation is not visualized. Mild to moderate aortic valve sclerosis/calcification is present, without any evidence of aortic stenosis. Pulmonic Valve: The pulmonic valve was normal in structure. Pulmonic valve regurgitation is not visualized. No evidence of pulmonic stenosis. Aorta: The aortic root is normal in size and structure. Venous: The inferior vena cava is  normal in size with greater than 50% respiratory variability, suggesting right atrial pressure of 3 mmHg. IAS/Shunts: No atrial level shunt detected by color flow Doppler.  LEFT VENTRICLE PLAX 2D LVIDd:         3.95 cm  Diastology LVIDs:         3.07 cm  LV e' medial:    8.59 cm/s LV PW:         1.18 cm  LV E/e' medial:  5.9 LV IVS:        1.26 cm  LV e' lateral:   7.40 cm/s LVOT diam:     2.10 cm  LV E/e' lateral: 6.9 LVOT Area:     3.46 cm  LEFT ATRIUM             Index       RIGHT ATRIUM           Index LA diam:        3.50 cm 1.52 cm/m  RA Area:     18.80 cm LA Vol (A2C):   25.4 ml 11.00 ml/m RA Volume:   45.30 ml  19.62 ml/m LA Vol (A4C):   36.9 ml 15.98 ml/m LA Biplane Vol: 30.8 ml 13.34 ml/m   AORTA Ao Root diam: 3.10 cm MITRAL VALVE               TRICUSPID VALVE MV Area (PHT): 2.26 cm    TR Peak grad:   17.8 mmHg MV Decel Time: 335 msec    TR Vmax:        211.00 cm/s MV E velocity: 51.00 cm/s MV A velocity: 83.60 cm/s  SHUNTS MV E/A ratio:  0.61        Systemic Diam: 2.10 cm Charlton Haws MD Electronically signed by Charlton Haws MD Signature Date/Time: 06/02/2020/4:31:20 PM    Final    CT HEAD CODE STROKE WO CONTRAST  Result Date: 06/01/2020 CLINICAL DATA:  Code stroke.  Right arm numbness EXAM: CT HEAD WITHOUT CONTRAST TECHNIQUE: Contiguous axial images were obtained from the base of the skull through the vertex without intravenous contrast. COMPARISON:  None. FINDINGS: Brain: Normal appearance without evidence of atrophy, old or acute infarction, mass lesion, hemorrhage, hydrocephalus or extra-axial collection. Vascular: There is atherosclerotic calcification of the major vessels at the base of the brain. Skull: Negative Sinuses/Orbits: Minimal ethmoid sinus opacification. Orbits negative. Other: None ASPECTS ([place]berta Stroke Program Early CT Score) - Ganglionic level infarction (caudate, lentiform nuclei, internal capsule, insula, M1-M3 cortex):  7 - Supraganglionic infarction (M4-M6 cortex): 3  Total score (0-10 with 10 being normal): 10 IMPRESSION: 1. Normal head CT for age. 2. ASPECTS is 10. 3. These results were called by telephone at the time of interpretation on 06/01/2020 at 5:19 pm to provider Vanetta Mulders , who verbally acknowledged these results. Electronically Signed   By: Paulina Fusi M.D.   On: 06/01/2020 17:20    Microbiology: Recent Results (from the past 240 hour(s))  Resp Panel by RT-PCR (Flu A&B, Covid) Nasopharyngeal Swab     Status: None   Collection Time: 06/01/20  5:06 PM   Specimen: Nasopharyngeal Swab; Nasopharyngeal(NP) swabs in vial transport medium  Result Value Ref Range Status   SARS Coronavirus 2 by RT PCR NEGATIVE NEGATIVE Final    Comment: (NOTE) SARS-CoV-2 target nucleic acids are NOT DETECTED.  The SARS-CoV-2 RNA is generally detectable in upper respiratory specimens during the acute phase of infection. The lowest concentration of SARS-CoV-2 viral copies this assay can detect is 138 copies/mL. A negative result does not preclude SARS-Cov-2 infection and should not be used as the sole basis for treatment or other patient management decisions. A negative result may occur with  improper specimen collection/handling, submission of specimen other than nasopharyngeal swab, presence of viral mutation(s) within the areas targeted by this assay, and inadequate number of viral copies(<138 copies/mL). A negative result must be combined with clinical observations, patient history, and epidemiological information. The expected result is Negative.  Fact Sheet for Patients:  BloggerCourse.com  Fact Sheet for Healthcare Providers:  SeriousBroker.it  This test is no t yet approved or cleared by the [place] FDA and  has been authorized for detection and/or diagnosis of SARS-CoV-2 by FDA under an Emergency Use Authorization (EUA). This EUA will remain  in effect (meaning this test can be used) for the  duration of the COVID-19 declaration under Section 564(b)(1) of the Act, 21 U.S.C.section 360bbb-3(b)(1), unless the authorization is terminated  or revoked sooner.       Influenza A by PCR NEGATIVE NEGATIVE Final   Influenza B by PCR NEGATIVE NEGATIVE Final    Comment: (NOTE) The Xpert Xpress SARS-CoV-2/FLU/RSV plus assay is intended as an aid in the diagnosis of influenza from Nasopharyngeal swab specimens and should not be used as a sole basis for treatment. Nasal washings and aspirates are unacceptable for Xpert Xpress SARS-CoV-2/FLU/RSV testing.  Fact Sheet for Patients: BloggerCourse.com  Fact Sheet for Healthcare Providers: SeriousBroker.it  This test is not yet approved or cleared by the [place] FDA and has been authorized for detection and/or diagnosis of SARS-CoV-2 by FDA under an Emergency Use Authorization (EUA). This EUA will remain in effect (meaning this test can be used) for the duration of the COVID-19 declaration under Section 564(b)(1) of the Act, 21 U.S.C. section 360bbb-3(b)(1), unless the authorization is terminated or revoked.  Performed at [facility], [address], [place], [place] [address]      Labs: Basic Metabolic Panel: Recent Labs  Lab 06/01/20 1706 06/01/20 1728 06/01/20 1909 06/02/20 0449  NA 135 140  --  139  K 3.4* 3.5  --  3.4*  CL 105 105  --  108  CO2 21*  --   --  22  GLUCOSE 111* 104*  --  100*  BUN 22 22  --  17  CREATININE 1.89* 1.90* 1.86* 1.35*  CALCIUM 8.6*  --   --  8.8*   Liver Function Tests: Recent Labs  Lab 06/01/20 1706  AST 21  ALT 25  ALKPHOS 39  BILITOT 0.7  PROT 6.6  ALBUMIN 3.9   CBC: Recent Labs  Lab 06/01/20 1706 06/01/20 1728 06/01/20 1909  WBC 6.9  --  7.1  NEUTROABS 5.2  --   --   HGB 13.7 14.3 13.7  HCT 43.0 42.0 42.5  MCV 90.9  --  91.8  PLT 183  --  193   CBG: Recent Labs  Lab 06/01/20 1703 06/01/20 2043 06/01/20 2235  06/02/20 0748 06/02/20 1206  GLUCAP 110* 65* 155* 108* 178*    Signed:  Vassie Loll MD.  [facility] 06/02/2020, 4:55 PM

## 2020-06-02 NOTE — Care Management CC44 (Signed)
Condition Code 44 Documentation Completed  Patient Details  Name: Kyle Marks MRN: [id number] Date of Birth: 13-Aug-1955   Condition Code 44 given:  Yes Patient signature on Condition Code 44 notice:  Yes Documentation of 2 MD's agreement:  Yes Code 44 added to claim:  Yes    Elliot Gault, LCSW 06/02/2020, 2:18 PM

## 2020-06-02 NOTE — Progress Notes (Signed)
SLP Cancellation Note  Patient Details Name: Kyle Marks MRN: [id number] DOB: 11/02/1955   Cancelled treatment:       Reason Eval/Treat Not Completed: SLP screened, no needs identified, will sign off. All symptoms have resolved at this time. Thank you,  Atarah Cadogan H. Romie Levee, CCC-SLP Speech Language Pathologist    Georgetta Haber 06/02/2020, 11:01 AM

## 2020-06-02 NOTE — Care Management Important Message (Signed)
Important Message  Patient Details  Name: Kyle Marks MRN: [id number] Date of Birth: 08-04-1955   Medicare Important Message Given:  Yes     Corey Harold 06/02/2020, 1:25 PM

## 2020-06-02 NOTE — Evaluation (Signed)
Occupational Therapy Evaluation Patient Details Name: Kyle Marks MRN: [id number] DOB: 12/20/1955 Today's Date: 06/02/2020    History of Present Illness Kyle Marks is a 65 y.o. male with medical history significant of peripheral vascular disease, coronary artery disease, type 2 diabetes mellitus, hypertension dyslipidemia who presents with focal neurologic deficit.  He was at a local grocery store when he suddenly experienced right lower and upper extremity numbness and weakness, predominantly on his lower extremity.  Moderate to severe intensity, to the point where he had difficulty ambulating, no improving or worsening factors.  No associated pain.   Clinical Impression   Pt pleasant and agreeable to OT evaluation. Pt demonstrated UE A/ROM and strength WFL during tasks assessed and while seated at EOB. Able to ambulate independently to sink  and complete hand washing and brushing of teeth independently and ambulate back to EOB. Pt is not recommended for further acute OT due to being well within functional limits for ADLs and transfers.     Follow Up Recommendations  No OT follow up    Equipment Recommendations  None recommended by OT           Precautions / Restrictions Precautions Precautions: None Restrictions Weight Bearing Restrictions: No      Mobility Bed Mobility Overal bed mobility: Independent                  Transfers Overall transfer level: Independent Equipment used: None                  Balance Overall balance assessment: Independent                                         ADL either performed or assessed with clinical judgement   ADL Overall ADL's : Independent                                             Vision Baseline Vision/History: Wears glasses                  Pertinent Vitals/Pain Pain Assessment: No/denies pain     Hand Dominance Right   Extremity/Trunk Assessment  Upper Extremity Assessment Upper Extremity Assessment: Overall WFL for tasks assessed   Lower Extremity Assessment Lower Extremity Assessment: Defer to PT evaluation   Cervical / Trunk Assessment Cervical / Trunk Assessment: Normal   Communication Communication Communication: No difficulties   Cognition Arousal/Alertness: Awake/alert Behavior During Therapy: WFL for tasks assessed/performed Overall Cognitive Status: Within Functional Limits for tasks assessed                                                      Home Living Family/patient expects to be discharged to:: Private residence Living Arrangements: Spouse/significant other   Type of Home: House Home Access: Stairs to enter Secretary/administrator of Steps: 3 Entrance Stairs-Rails: Right;Left Home Layout: One level     Bathroom Shower/Tub: Chief Strategy Officer: Standard Bathroom Accessibility: Yes   Home Equipment: None (Reports that family probably has equipment he can use if needed.)  Prior Functioning/Environment Level of Independence: Independent        Comments: Patient states community ambulator without AD, independent with ADL, exercises/weight lifting most days                           End of Session    Activity Tolerance:   Patient left: in bed;with call bell/phone within reach                   Time: 0823-0836 OT Time Calculation (min): 13 min Charges:  OT General Charges $OT Visit: 1 Visit OT Evaluation $OT Eval Low Complexity: 1 Low  Najee Cowens OT, MOT   Danie Chandler 06/02/2020, 9:58 AM

## 2020-06-02 NOTE — Evaluation (Signed)
Physical Therapy Evaluation Patient Details Name: Kyle Marks MRN: [id number] DOB: 1956-02-21 Today's Date: 06/02/2020   History of Present Illness  Kyle Marks is a 65 y.o. male with medical history significant of peripheral vascular disease, coronary artery disease, type 2 diabetes mellitus, hypertension dyslipidemia who presents with focal neurologic deficit.  He was at a local grocery store when he suddenly experienced right lower and upper extremity numbness and weakness, predominantly on his lower extremity.  Moderate to severe intensity, to the point where he had difficulty ambulating, no improving or worsening factors.  No associated pain.     Clinical Impression  Patient functioning at baseline for functional mobility and gait. Patient without LE deficits in strength or sensation today. Patient does not require assist for mobility today and ambulates without AD or loss of balance. Patient educated on general exercise routine and strength training as well as beginning LE strengthening exercise. Patient discharged to care of nursing for ambulation daily as tolerated for length of stay.      Follow Up Recommendations No PT follow up    Equipment Recommendations  None recommended by PT    Recommendations for Other Services       Precautions / Restrictions Precautions Precautions: None Restrictions Weight Bearing Restrictions: No      Mobility  Bed Mobility Overal bed mobility: Independent                  Transfers Overall transfer level: Independent Equipment used: None                Ambulation/Gait Ambulation/Gait assistance: Independent Gait Distance (Feet): 200 Feet Assistive device: None Gait Pattern/deviations: WFL(Within Functional Limits)     General Gait Details: ambulates without AD or loss of balance  Stairs            Wheelchair Mobility    Modified Rankin (Stroke Patients Only)       Balance Overall balance  assessment: Independent                                           Pertinent Vitals/Pain Pain Assessment: No/denies pain    Home Living Family/patient expects to be discharged to:: Private residence Living Arrangements: Spouse/significant other   Type of Home: House Home Access: Stairs to enter Entrance Stairs-Rails: Doctor, general practice of Steps: 3 Home Layout: One level Home Equipment: None      Prior Function Level of Independence: Independent         Comments: Patient states community ambulator without AD, independent with ADL, exercises/weight lifting most days     Hand Dominance        Extremity/Trunk Assessment   Upper Extremity Assessment Upper Extremity Assessment: Defer to OT evaluation    Lower Extremity Assessment Lower Extremity Assessment: Overall WFL for tasks assessed    Cervical / Trunk Assessment Cervical / Trunk Assessment: Normal  Communication   Communication: No difficulties  Cognition Arousal/Alertness: Awake/alert Behavior During Therapy: WFL for tasks assessed/performed Overall Cognitive Status: Within Functional Limits for tasks assessed                                        General Comments      Exercises     Assessment/Plan    PT Assessment Patent  does not need any further PT services  PT Problem List         PT Treatment Interventions      PT Goals (Current goals can be found in the Care Plan section)  Acute Rehab PT Goals Patient Stated Goal: Return home PT Goal Formulation: With patient Time For Goal Achievement: 06/02/20 Potential to Achieve Goals: Good    Frequency     Barriers to discharge        Co-evaluation               AM-PAC PT "6 Clicks" Mobility  Outcome Measure Help needed turning from your back to your side while in a flat bed without using bedrails?: None Help needed moving from lying on your back to sitting on the side of a flat bed  without using bedrails?: None Help needed moving to and from a bed to a chair (including a wheelchair)?: None Help needed standing up from a chair using your arms (e.g., wheelchair or bedside chair)?: None Help needed to walk in hospital room?: None Help needed climbing 3-5 steps with a railing? : None 6 Click Score: 24    End of Session   Activity Tolerance: Patient tolerated treatment well Patient left: in bed;with call bell/phone within reach Nurse Communication: Mobility status PT Visit Diagnosis: Unsteadiness on feet (R26.81);Other abnormalities of gait and mobility (R26.89)    Time: 5686-1683 PT Time Calculation (min) (ACUTE ONLY): 17 min   Charges:   PT Evaluation $PT Eval Low Complexity: 1 Low PT Treatments $Therapeutic Activity: 8-22 mins        9:41 AM, 06/02/20 Wyman Songster PT, DPT Physical Therapist at [facility]

## 2020-06-13 NOTE — Progress Notes (Signed)
Cardiology Office Note   Date:  06/13/2020   ID:  Kyle Alar., DOB 17-Jan-1956, MRN [id number]  PCP:  Salome Holmes, MD  Cardiologist:   Dietrich Pates, MD   F/U of HTN    History of Present Illness: Kyle Lafitte. is a 65 y.o. male with a history of HTN, DM, PVOD (s/p iliac stent), HL    He was seen at [facility] ED on 11/25/18 with dizziness.   Pt reports positional, worse with looking down, turnnig head too fast, change in position.   This resolved  He was last seen in [place] in June  2021 Since seen he has done OK  He denies CP  Breathing is OK He is walking about 1.5 miles per day Working on diet    Br  4x per wk eggs and bacon  Coffee Lunch  Usually skips Dinner   Soup   Occasional dessert   I saw the pt in Dec 2021  No outpatient medications have been marked as taking for the 06/14/20 encounter (Appointment) with Pricilla Riffle, MD.     Allergies:   Patient has no known allergies.   Past Medical History:  Diagnosis Date  . Coronary artery disease    pt reports he has stents  . Diabetes mellitus without complication (HCC)   . Hyperlipidemia   . Hypertension     Past Surgical History:  Procedure Laterality Date  . CARDIAC SURGERY       Social History:  The patient  reports that he quit smoking about 2 years ago. He has never used smokeless tobacco. He reports previous alcohol use. He reports that he does not use drugs.   Family History:  The patient's family history includes Alzheimer's disease in his mother; CAD in his brother; Heart attack in his father; Hypertension in his brother.    ROS:  Please see the history of present illness. All other systems are reviewed and  Negative to the above problem except as noted.    PHYSICAL EXAM: VS:  There were no vitals taken for this visit.  GEN: Morbidly obese 65 yo  in no acute distress  HEENT: normal  Neck: no JVD,  No carotid bruits, Cardiac: RRR; no murmurs No LE  edema  Respiratory:  clear to  auscultation bilaterally, normal work of breathing GI: soft, nontender, distended  No masses MS: no deformity Moving all extremities   Skin: warm and dry, no rash Neuro:  Strength and sensation are intact Psych: euthymic mood, full affect   EKG:  EKG is ordered today.  SR 70 bpm    Lipid Panel    Component Value Date/Time   CHOL 83 06/02/2020 0449   TRIG 118 06/02/2020 0449   HDL 29 (L) 06/02/2020 0449   CHOLHDL 2.9 06/02/2020 0449   VLDL 24 06/02/2020 0449   LDLCALC 30 06/02/2020 0449   LDLCALC 89 04/23/2019 1033      Wt Readings from Last 3 Encounters:  06/01/20 247 lb 9.6 oz (112.3 kg)  04/13/20 254 lb 9.6 oz (115.5 kg)  09/16/19 261 lb (118.4 kg)      ASSESSMENT AND PLAN:  1  HTN BP is fair  I would keep on same regimen  Get labs  Encouraged wt loss  2  Dizziness  Pt denies     3  Irreg HB Pt had monitor in past   Denies palpitations    3  HL  On Crestor  Will  get lipds   4  Hx PVOD   S/p iliac stent  On plavix  Continue  No symptoms   5  Hx DM Will check A1C  6  Obesity   Disucssed TRE and minimizing carbs  F/U in late summer   Will look into rec for PCP for pt   Wants someone closer to home ([place] pricey)  F/U in Jan / Feb,  Sooner for probems   Given that he is established with a provider through the [facility] system who is addressing above problems, I would not recomm f/u as initially planned     Signed, Shera Laubach, MD  06/13/2020 10:29 PM    [facility] [address], [place], [place]  [address] Phone: [phone]; Fax: [phone]    

## 2020-06-13 NOTE — H&P (View-Only) (Signed)
Cardiology Office Note   Date:  06/13/2020   ID:  Kyle Alar., DOB 17-Jan-1956, MRN [id number]  PCP:  Salome Holmes, MD  Cardiologist:   Dietrich Pates, MD   F/U of HTN    History of Present Illness: Kyle Lafitte. is a 65 y.o. male with a history of HTN, DM, PVOD (s/p iliac stent), HL    He was seen at [facility] ED on 11/25/18 with dizziness.   Pt reports positional, worse with looking down, turnnig head too fast, change in position.   This resolved  He was last seen [place] in [place]  2021 Since seen he has done OK  He denies CP  Breathing is OK He is walking about 1.5 miles per day Working on diet    Br  4x per wk eggs and bacon  Coffee Lunch  Usually skips Dinner   Soup   Occasional dessert   I saw the pt in Dec 2021  No outpatient medications have been marked as taking for the 06/14/20 encounter (Appointment) with Pricilla Riffle, MD.     Allergies:   Patient has no known allergies.   Past Medical History:  Diagnosis Date  . Coronary artery disease    pt reports he has stents  . Diabetes mellitus without complication (HCC)   . Hyperlipidemia   . Hypertension     Past Surgical History:  Procedure Laterality Date  . CARDIAC SURGERY       Social History:  The patient  reports that he quit smoking about 2 years ago. He has never used smokeless tobacco. He reports previous alcohol use. He reports that he does not use drugs.   Family History:  The patient's family history includes Alzheimer's disease in his mother; CAD in his brother; Heart attack in his father; Hypertension in his brother.    ROS:  Please see the history of present illness. All other systems are reviewed and  Negative to the above problem except as noted.    PHYSICAL EXAM: VS:  There were no vitals taken for this visit.  GEN: Morbidly obese 65 yo  in no acute distress  HEENT: normal  Neck: no JVD,  No carotid bruits, Cardiac: RRR; no murmurs No LE  edema  Respiratory:  clear to  auscultation bilaterally, normal work of breathing GI: soft, nontender, distended  No masses MS: no deformity Moving all extremities   Skin: warm and dry, no rash Neuro:  Strength and sensation are intact Psych: euthymic mood, full affect   EKG:  EKG is ordered today.  SR 70 bpm    Lipid Panel    Component Value Date/Time   CHOL 83 06/02/2020 0449   TRIG 118 06/02/2020 0449   HDL 29 (L) 06/02/2020 0449   CHOLHDL 2.9 06/02/2020 0449   VLDL 24 06/02/2020 0449   LDLCALC 30 06/02/2020 0449   LDLCALC 89 04/23/2019 1033      Wt Readings from Last 3 Encounters:  06/01/20 247 lb 9.6 oz (112.3 kg)  04/13/20 254 lb 9.6 oz (115.5 kg)  09/16/19 261 lb (118.4 kg)      ASSESSMENT AND PLAN:  1  HTN BP is fair  I would keep on same regimen  Get labs  Encouraged wt loss  2  Dizziness  Pt denies     3  Irreg HB Pt had monitor in past   Denies palpitations    3  HL  On Crestor  Will  get lipds   4  Hx PVOD   S/p iliac stent  On plavix  Continue  No symptoms   5  Hx DM Will check A1C  6  Obesity   Disucssed TRE and minimizing carbs  F/U in late summer   Will look into rec for PCP for pt   Wants someone closer to home (gas pricey)  F/U in Jan / Feb,  Sooner for probems   Given that he is established with a provider through the [facility] system who is addressing above problems, I would not recomm f/u as initially planned     Signed, Dietrich Pates, MD  06/13/2020 10:29 PM    [facility] [address] [place], [place], [place]  [address] Phone: [phone]; Fax: [phone]

## 2020-06-14 ENCOUNTER — Encounter: Payer: Self-pay | Admitting: *Deleted

## 2020-06-14 ENCOUNTER — Other Ambulatory Visit: Payer: Self-pay

## 2020-06-14 ENCOUNTER — Ambulatory Visit (INDEPENDENT_AMBULATORY_CARE_PROVIDER_SITE_OTHER): Payer: Medicare Other | Admitting: Internal Medicine

## 2020-06-14 ENCOUNTER — Encounter: Payer: Self-pay | Admitting: Internal Medicine

## 2020-06-14 VITALS — BP 136/70 | HR 78 | Ht 71.0 in | Wt 243.6 lb

## 2020-06-14 DIAGNOSIS — I639 Cerebral infarction, unspecified: Secondary | ICD-10-CM

## 2020-06-14 NOTE — Patient Instructions (Addendum)
Medication Instructions:  No changes *If you need a refill on your cardiac medications before your next appointment, please call your pharmacy*   Lab Work: Due to recent COVID-19 restrictions implemented by our local and state authorities and in an effort to keep both patients and staff as safe as possible, our hospital system requires COVID-19 testing prior to certain scheduled hospital procedures.  Please go to [address] [facility]. [place], [place] [address] on 06/19/20 at 1:20pm  .  This is a drive up testing site.  You will not need to exit your vehicle.  You will not be billed at the time of testing but may receive a bill later depending on your insurance. You must agree to self-quarantine from the time of your testing until the procedure date on 06/21/20.  This should included staying home with ONLY the people you live with.  Avoid take-out, grocery store shopping or leaving the house for any non-emergent reason.  Failure to have your COVID-19 test done on the date and time you have been scheduled will result in cancellation of your procedure.  Please call our office at [phone] if you have any questions.   Testing/Procedures: Your physician has recommended that you have a Cardioversion (DCCV). Electrical Cardioversion uses a jolt of electricity to your heart either through paddles or wired patches attached to your chest. This is a controlled, usually prescheduled, procedure. Defibrillation is done under light anesthesia in the hospital, and you usually go home the day of the procedure. This is done to get your heart back into a normal rhythm. You are not awake for the procedure. Please see the instruction sheet given to you today.  Your physician has recommended that you wear an event monitor. Event monitors are medical devices that record the heart's electrical activity. Doctors most often [place] these monitors to diagnose arrhythmias. Arrhythmias are problems with the speed or rhythm of the heartbeat.  The monitor is a small, portable device. You can wear one while you do your normal daily activities. This is usually used to diagnose what is causing palpitations/syncope (passing out).  Your physician has requested that you have a lower extremity venous duplex. This test is an ultrasound of the veins in the legs. It looks at venous blood flow that carries blood from the heart to the legs. Allow one hour for a Lower Venous exam.  There are no restrictions or special instructions.   Follow-Up: At [facility], you and your health needs are our priority.  As part of our continuing mission to provide you with exceptional heart care, we have created designated Provider Care Teams.  These Care Teams include your primary Cardiologist (physician) and Advanced Practice Providers (APPs -  Physician Assistants and Nurse Practitioners) who all work together to provide you with the care you need, when you need it.  We recommend signing up for the patient portal called "MyChart".  Sign up information is provided on this After Visit Summary.  MyChart is used to connect with patients for Virtual Visits (Telemedicine).  Patients are able to view lab/test results, encounter notes, upcoming appointments, etc.  Non-urgent messages can be sent to your provider as well.   To learn more about what you can do with MyChart, go to [email or web address].    Your next appointment:   2 month(s)  The format for your next appointment:   In Person  Provider:   You may see Dr. Dietrich Pates or one of the following Advanced Practice Providers on your  designated Care Team:    Tereso Newcomer, PA-C  Chelsea Aus, [place]   Other Instructions Dear Mr. Kyle Marks are scheduled for a TEE on Wednesday March 9 with Dr. Elease Hashimoto.  Please arrive at the [facility] (Main Entrance A) at [facility]: [address] [place], [place] [address] at 12:00 pm.   DIET: Nothing to eat or drink after midnight except a sip of water with  medications (see medication instructions below)  Medication Instructions:  Continue your anticoagulant: aspirin and Brilinta You will need to continue your anticoagulant after your procedure  until you are told by your Provider that it is safe to stop  You must have a responsible person to drive you home and stay in the waiting area during your procedure. Failure to do so could result in cancellation.  Bring your insurance cards.  *Special Note: Every effort is made to have your procedure done on time. Occasionally there are emergencies that occur at the hospital that may cause delays. Please be patient if a delay does occur.

## 2020-06-14 NOTE — Progress Notes (Signed)
Cardiology Office Note   Date:  06/14/2020   ID:  Kyle Sterling., DOB 1955/10/06, MRN [id number]  PCP:  Salome Holmes, MD  Cardiologist:   Dietrich Pates, MD   F/U of HTN    History of Present Illness: Kyle Krueger. is a 65 y.o. male with a history of HTN, DM, PVOD (s/p iliac stent), HL    He was seen at [facility] ED on 11/25/18 with dizziness.   Pt reports positional, worse with looking down, turnnig head too fast, change in position.   This resolved   I saw the pt in Dec 2021  Pt was recently discharged from [facility] after  He was at [employer] numb and limp on R arm , R leg and back   Sat down    Resolved   Went to ED   Admitted MRI showed smlall acute strokes  He had itchy feeling in R leg prior to event   This resolved after event  No symptoms since hosp    Breathing a little short when works out (100lb wts)    BP scary at time     Current Meds  Medication Sig  . amLODipine (NORVASC) 10 MG tablet Take 1 tablet (10 mg total) by mouth daily.  Marland Kitchen aspirin EC 81 MG EC tablet Take 1 tablet (81 mg total) by mouth daily. Swallow whole.  . ezetimibe (ZETIA) 10 MG tablet Take 1 tablet (10 mg total) by mouth daily.  Marland Kitchen glimepiride (AMARYL) 4 MG tablet Take 4 mg by mouth at bedtime.   Marland Kitchen losartan-hydrochlorothiazide (HYZAAR) 100-12.5 MG tablet Take 1 tablet by mouth daily.  . metFORMIN (GLUCOPHAGE-XR) 500 MG 24 hr tablet Take 1,000 mg by mouth 2 (two) times daily.  . metoprolol tartrate (LOPRESSOR) 50 MG tablet Take 50 mg by mouth 2 (two) times daily.  Marland Kitchen omega-3 acid ethyl esters (LOVAZA) 1 g capsule Take 1 capsule (1 g total) by mouth 2 (two) times daily.  . rosuvastatin (CRESTOR) 10 MG tablet TAKE 1 TABLET BY MOUTH EVERY DAY  . ticagrelor (BRILINTA) 90 MG TABS tablet Take 1 tablet (90 mg total) by mouth 2 (two) times daily.     Allergies:   Patient has no known allergies.   Past Medical History:  Diagnosis Date  . Coronary artery disease    pt reports he has stents  .  Diabetes mellitus without complication (HCC)   . Hyperlipidemia   . Hypertension     Past Surgical History:  Procedure Laterality Date  . CARDIAC SURGERY       Social History:  The patient  reports that he quit smoking about 2 years ago. He has never used smokeless tobacco. He reports previous alcohol use. He reports that he does not use drugs.   Family History:  The patient's family history includes Alzheimer's disease in his mother; CAD in his brother; Heart attack in his father; Hypertension in his brother.    ROS:  Please see the history of present illness. All other systems are reviewed and  Negative to the above problem except as noted.    PHYSICAL EXAM: VS:  BP 136/70   Pulse 78   Ht 5\' 11"  (1.803 m)   Wt 243 lb 9.6 oz (110.5 kg)   SpO2 96%   BMI 33.98 kg/m   GEN: Morbidly obese 65 yo  in no acute distress  HEENT: normal  Neck: no JVD,  No carotid bruits, Cardiac: RRR;  no murmurs No LE  edema  Respiratory:  clear to auscultation bilaterally, normal work of breathing GI: soft, nontender, distended  No masses MS: no deformity Moving all extremities   Skin: warm and dry, no rash Neuro:  Strength and sensation are intact Psych: euthymic mood, full affect   EKG:  EKG is not ordered today.  Lipid Panel    Component Value Date/Time   CHOL 83 06/02/2020 0449   TRIG 118 06/02/2020 0449   HDL 29 (L) 06/02/2020 0449   CHOLHDL 2.9 06/02/2020 0449   VLDL 24 06/02/2020 0449   LDLCALC 30 06/02/2020 0449   LDLCALC 89 04/23/2019 1033      Wt Readings from Last 3 Encounters:  06/14/20 243 lb 9.6 oz (110.5 kg)  06/01/20 247 lb 9.6 oz (112.3 kg)  04/13/20 254 lb 9.6 oz (115.5 kg)      ASSESSMENT AND PLAN:  1  CVA  Recent event   MRI shows a couple lesions  Concern for embolic    Initially he said he did not want LINQ   Then amenable to it   For now would set up for TEE and also for 4 wk monitor Continue ASA and Brilinta for now   I am not sure about switch from  Plavix ? Data  2  HTN  BP is controlled   3  Dizziness  Pt denies     4  Hx of irreg HB  Plan for monitor    5  HL Continue Crestor    6  Hx PVOD   S/p iliac stent  On DAPT  Continue    7  Hx DM   8  Obesity   Discussed carbs and TRE on last visit       Given that he is established with a provider through the [facility] system who is addressing above problems, I would not recomm f/u as initially planned     Signed, Dietrich Pates, MD  06/14/2020 1:38 PM    Outpatient Services [facility] [address] [place], [place], [place]  [address] Phone: [phone]; Fax: [phone]

## 2020-06-14 NOTE — Progress Notes (Signed)
Patient ID: Kyle Alar., male   DOB: 05/28/1955, 65 y.o.   MRN: [id number] Patient enrolled for [employer] to ship a 30 day cardiac event monitor to his home. Letter with instructions mailed to patient.

## 2020-06-15 ENCOUNTER — Ambulatory Visit (HOSPITAL_COMMUNITY)
Admission: RE | Admit: 2020-06-15 | Discharge: 2020-06-15 | Disposition: A | Discharge status: Home / Self Care | Payer: Medicare Other | Source: Ambulatory Visit | Attending: Cardiovascular Disease | Admitting: Cardiovascular Disease

## 2020-06-15 DIAGNOSIS — I639 Cerebral infarction, unspecified: Secondary | ICD-10-CM | POA: Insufficient documentation

## 2020-06-19 ENCOUNTER — Other Ambulatory Visit (HOSPITAL_COMMUNITY)
Admission: RE | Admit: 2020-06-19 | Discharge: 2020-06-19 | Disposition: A | Discharge status: Home / Self Care | Payer: Medicare Other | Source: Ambulatory Visit | Attending: Cardiovascular Disease | Admitting: Cardiovascular Disease

## 2020-06-19 DIAGNOSIS — Z01812 Encounter for preprocedural laboratory examination: Secondary | ICD-10-CM | POA: Diagnosis present

## 2020-06-19 DIAGNOSIS — Z20822 Contact with and (suspected) exposure to covid-19: Secondary | ICD-10-CM | POA: Insufficient documentation

## 2020-06-19 LAB — SARS CORONAVIRUS 2 (TAT 6-24 HRS): SARS Coronavirus 2: NEGATIVE

## 2020-06-21 ENCOUNTER — Ambulatory Visit (HOSPITAL_COMMUNITY)
Admission: RE | Admit: 2020-06-21 | Discharge: 2020-06-21 | Disposition: A | Discharge status: Home / Self Care | Payer: Medicare Other | Attending: Cardiovascular Disease | Admitting: Cardiovascular Disease

## 2020-06-21 ENCOUNTER — Ambulatory Visit (HOSPITAL_COMMUNITY): Payer: Medicare Other | Admitting: Anesthesiology

## 2020-06-21 ENCOUNTER — Ambulatory Visit (HOSPITAL_BASED_OUTPATIENT_CLINIC_OR_DEPARTMENT_OTHER)
Admission: RE | Admit: 2020-06-21 | Discharge: 2020-06-21 | Disposition: A | Discharge status: Home / Self Care | Payer: Medicare Other | Source: Ambulatory Visit | Attending: Internal Medicine | Admitting: Internal Medicine

## 2020-06-21 ENCOUNTER — Encounter (HOSPITAL_COMMUNITY)
Admission: RE | Disposition: A | Discharge status: Home / Self Care | Payer: Self-pay | Source: Home / Self Care | Attending: Cardiovascular Disease

## 2020-06-21 ENCOUNTER — Other Ambulatory Visit: Payer: Self-pay

## 2020-06-21 ENCOUNTER — Encounter (HOSPITAL_COMMUNITY): Payer: Self-pay | Admitting: Cardiovascular Disease

## 2020-06-21 DIAGNOSIS — E119 Type 2 diabetes mellitus without complications: Secondary | ICD-10-CM | POA: Insufficient documentation

## 2020-06-21 DIAGNOSIS — E669 Obesity, unspecified: Secondary | ICD-10-CM | POA: Diagnosis not present

## 2020-06-21 DIAGNOSIS — Z87891 Personal history of nicotine dependence: Secondary | ICD-10-CM | POA: Insufficient documentation

## 2020-06-21 DIAGNOSIS — I639 Cerebral infarction, unspecified: Secondary | ICD-10-CM | POA: Diagnosis not present

## 2020-06-21 DIAGNOSIS — I10 Essential (primary) hypertension: Secondary | ICD-10-CM | POA: Diagnosis not present

## 2020-06-21 DIAGNOSIS — Z8249 Family history of ischemic heart disease and other diseases of the circulatory system: Secondary | ICD-10-CM | POA: Insufficient documentation

## 2020-06-21 DIAGNOSIS — Z7902 Long term (current) use of antithrombotics/antiplatelets: Secondary | ICD-10-CM | POA: Diagnosis not present

## 2020-06-21 DIAGNOSIS — I739 Peripheral vascular disease, unspecified: Secondary | ICD-10-CM | POA: Diagnosis not present

## 2020-06-21 DIAGNOSIS — I361 Nonrheumatic tricuspid (valve) insufficiency: Secondary | ICD-10-CM

## 2020-06-21 DIAGNOSIS — E785 Hyperlipidemia, unspecified: Secondary | ICD-10-CM | POA: Diagnosis not present

## 2020-06-21 DIAGNOSIS — Z6833 Body mass index (BMI) 33.0-33.9, adult: Secondary | ICD-10-CM | POA: Diagnosis not present

## 2020-06-21 HISTORY — PX: BUBBLE STUDY: SHX6837

## 2020-06-21 HISTORY — PX: TEE WITHOUT CARDIOVERSION: SHX5443

## 2020-06-21 SURGERY — ECHOCARDIOGRAM, TRANSESOPHAGEAL
Anesthesia: Monitor Anesthesia Care

## 2020-06-21 MED ORDER — SODIUM CHLORIDE 0.9 % IV SOLN: INTRAVENOUS | Status: DC

## 2020-06-21 MED ORDER — PROPOFOL 500 MG/50ML IV EMUL
INTRAVENOUS | Status: DC | PRN
  Administered 2020-06-21: 125 ug/kg/min via INTRAVENOUS

## 2020-06-21 MED ORDER — PROPOFOL 10 MG/ML IV BOLUS
INTRAVENOUS | Status: DC | PRN
  Administered 2020-06-21 (×4): 20 mg via INTRAVENOUS

## 2020-06-21 NOTE — Discharge Instructions (Signed)
Transesophageal Echocardiogram Transesophageal echocardiogram (TEE) is a test that uses sound waves to take pictures of your heart. TEE is done by passing a small probe attached to a flexible tube down the part of the body that moves food from your mouth to your stomach (esophagus). The pictures give clear images of your heart. This can help your doctor see if there are problems with your heart. Tell a doctor about:  Any allergies you have.  All medicines you are taking. This includes vitamins, herbs, eye drops, creams, and over-the-counter medicines.  Any problems you or family members have had with anesthetic medicines.  Any blood disorders you have.  Any surgeries you have had.  Any medical conditions you have.  Any swallowing problems.  Whether you have or have had a blockage in the part of the body that moves food from your mouth to your stomach.  Whether you are pregnant or may be pregnant. What are the risks? In general, this is a safe procedure. But, problems may occur, such as:  Damage to nearby structures or organs.  A tear in the part of the body that moves food from your mouth to your stomach.  Irregular heartbeat.  Hoarse voice or trouble swallowing.  Bleeding. What happens before the procedure? Medicines  Ask your doctor about changing or stopping: ? Your normal medicines. ? Vitamins, herbs, and supplements. ? Over-the-counter medicines.  Do not take aspirin or ibuprofen unless you are told to. General instructions  Follow instructions from your doctor about what you cannot eat or drink.  You will take out any dentures or dental retainers.  Plan to have a responsible adult take you home from the hospital or clinic.  Plan to have a responsible adult care for you for the time you are told after you leave the hospital or clinic. This is important. What happens during the procedure?  An IV will be put into one of your veins.  You may be given: ? A  sedative. This medicine helps you relax. ? A medicine to numb the back of your throat. This may be sprayed or gargled.  Your blood pressure, heart rate, and breathing will be watched.  You may be asked to lie on your left side.  A bite block will be placed in your mouth. This keeps you from biting the tube.  The tip of the probe will be placed into the back of your mouth.  You will be asked to swallow.  Your doctor will take pictures of your heart.  The probe and bite block will be taken out after the test is done. The procedure may vary among doctors and hospitals.   What can I expect after the procedure?  You will be monitored until you leave the hospital or clinic. This includes checking your blood pressure, heart rate, breathing rate, and blood oxygen level.  Your throat may feel sore and numb. This will get better over time. You will not be allowed to eat or drink until the numbness has gone away.  It is common to have a sore throat for a day or two.  It is up to you to get the results of your procedure. Ask how to get your results when they are ready. Follow these instructions at home:  If you were given a sedative during your procedure, do not drive or use machines until your doctor says that it is safe.  Return to your normal activities when your doctor says that it is safe.    Keep all follow-up visits. Summary  TEE is a test that uses sound waves to take pictures of your heart.  You will be given a medicine to help you relax.  Do not drive or use machines until your doctor says that it is safe. This information is not intended to replace advice given to you by your health care provider. Make sure you discuss any questions you have with your health care provider. Document Revised: 11/23/2019 Document Reviewed: 11/23/2019 Elsevier Patient Education  2021 Elsevier Inc.  

## 2020-06-21 NOTE — Anesthesia Procedure Notes (Signed)
Procedure Name: MAC Date/Time: 06/21/2020 1:35 PM Performed by: Amadeo Garnet, CRNA Pre-anesthesia Checklist: Patient identified, Emergency Drugs available, Suction available and Patient being monitored Patient Re-evaluated:Patient Re-evaluated prior to induction Oxygen Delivery Method: Nasal cannula Preoxygenation: Pre-oxygenation with 100% oxygen Induction Type: IV induction Placement Confirmation: positive ETCO2 Dental Injury: Teeth and Oropharynx as per pre-operative assessment

## 2020-06-21 NOTE — Anesthesia Preprocedure Evaluation (Addendum)
Anesthesia Evaluation  Patient identified by MRN, date of birth, ID band Patient awake    Reviewed: Allergy & Precautions, H&P , NPO status , Patient's Chart, lab work & pertinent test results, reviewed documented beta blocker date and time   Airway Mallampati: II  TM Distance: >3 FB Neck ROM: Full    Dental no notable dental hx. (+) Teeth Intact, Dental Advisory Given   Pulmonary neg pulmonary ROS, former smoker,    Pulmonary exam normal breath sounds clear to auscultation       Cardiovascular hypertension, Pt. on medications + CAD and + Cardiac Stents   Rhythm:Regular Rate:Normal     Neuro/Psych CVA negative psych ROS   GI/Hepatic negative GI ROS, Neg liver ROS,   Endo/Other  diabetes, Type 2, Oral Hypoglycemic Agents  Renal/GU Renal disease  negative genitourinary   Musculoskeletal   Abdominal   Peds  Hematology negative hematology ROS (+)   Anesthesia Other Findings   Reproductive/Obstetrics negative OB ROS                           Anesthesia Physical Anesthesia Plan  ASA: III  Anesthesia Plan: MAC   Post-op Pain Management:    Induction: Intravenous  PONV Risk Score and Plan: 1 and Propofol infusion and Treatment may vary due to age or medical condition  Airway Management Planned: Nasal Cannula  Additional Equipment:   Intra-op Plan:   Post-operative Plan:   Informed Consent: I have reviewed the patients History and Physical, chart, labs and discussed the procedure including the risks, benefits and alternatives for the proposed anesthesia with the patient or authorized representative who has indicated his/her understanding and acceptance.     Dental advisory given  Plan Discussed with: CRNA  Anesthesia Plan Comments:         Anesthesia Quick Evaluation

## 2020-06-21 NOTE — Interval H&P Note (Signed)
History and Physical Interval Note:  06/21/2020 1:12 PM  Kyle Marks.  has presented today for surgery, with the diagnosis of STROKE.  The various methods of treatment have been discussed with the patient and family. After consideration of risks, benefits and other options for treatment, the patient has consented to  Procedure(s): TRANSESOPHAGEAL ECHOCARDIOGRAM (TEE) (N/A) as a surgical intervention.  The patient's history has been reviewed, patient examined, no change in status, stable for surgery.  I have reviewed the patient's chart and labs.  Questions were answered to the patient's satisfaction.     Kristeen Miss

## 2020-06-21 NOTE — Transfer of Care (Signed)
Immediate Anesthesia Transfer of Care Note  Patient: Kyle Marks.  Procedure(s) Performed: TRANSESOPHAGEAL ECHOCARDIOGRAM (TEE) (N/A ) BUBBLE STUDY  Patient Location: Endoscopy Unit  Anesthesia Type:MAC  Level of Consciousness: awake, alert  and oriented  Airway & Oxygen Therapy: Patient Spontanous Breathing and Patient connected to nasal cannula oxygen  Post-op Assessment: Report given to RN, Post -op Vital signs reviewed and stable and Patient moving all extremities  Post vital signs: Reviewed and stable  Last Vitals:  Vitals Value Taken Time  BP 112/55 06/21/20 1356  Temp 36.4 C 06/21/20 1356  Pulse 57 06/21/20 1359  Resp 15 06/21/20 1359  SpO2 96 % 06/21/20 1359  Vitals shown include unvalidated device data.  Last Pain:  Vitals:   06/21/20 1356  TempSrc: Temporal  PainSc: 0-No pain         Complications: No complications documented.

## 2020-06-21 NOTE — Anesthesia Postprocedure Evaluation (Signed)
Anesthesia Post Note  Patient: Kyle Marks.  Procedure(s) Performed: TRANSESOPHAGEAL ECHOCARDIOGRAM (TEE) (N/A ) BUBBLE STUDY     Patient location during evaluation: Endoscopy Anesthesia Type: MAC Level of consciousness: awake and alert Pain management: pain level controlled Vital Signs Assessment: post-procedure vital signs reviewed and stable Respiratory status: spontaneous breathing, nonlabored ventilation and respiratory function stable Cardiovascular status: stable and blood pressure returned to baseline Postop Assessment: no apparent nausea or vomiting Anesthetic complications: no   No complications documented.  Last Vitals:  Vitals:   06/21/20 1405 06/21/20 1415  BP: 114/61 124/69  Pulse: (!) 54 (!) 56  Resp: 16 14  Temp:    SpO2: 97% 96%    Last Pain:  Vitals:   06/21/20 1415  TempSrc:   PainSc: 0-No pain                 Axelle Szwed,W. EDMOND

## 2020-06-21 NOTE — CV Procedure (Signed)
    Transesophageal Echocardiogram Note  Kyle Marks [id number] 02-02-1956  Procedure: Transesophageal Echocardiogram Indications: CVA   Procedure Details Consent: Obtained Time Out: Verified patient identification, verified procedure, site/side was marked, verified correct patient position, special equipment/implants available, Radiology Safety Procedures followed,  medications/allergies/relevent history reviewed, required imaging and test results available.  Performed  Medications:  During this procedure the patient is administered a propofol drip by  CRNA Roselyn Meier ( total propofol 214 mg ) sedation.  The patient's heart rate, blood pressure, and oxygen saturation are monitored continuously during the procedure. The period of conscious sedation is 30 minutes, of which I was present face-to-face 100% of this time.  Left Ventrical:  Normal LV function   Mitral Valve:  Mild MR   Aortic Valve:  3 leaflet valve   Tricuspid Valve:  Moderate TR   Pulmonic Valve: trace PI   Left Atrium/ Left atrial appendage: no thrombi   Atrial septum:  No ASD or PFO by color doppler and bubble study   Aorta: mild - moderate calcified plaque    Complications: No apparent complications Patient did tolerate procedure well.   Vesta Mixer, Montez Hageman., MD, [facility] 06/21/2020, 1:51 PM

## 2020-06-23 ENCOUNTER — Other Ambulatory Visit: Payer: Self-pay | Admitting: *Deleted

## 2020-06-23 ENCOUNTER — Encounter (HOSPITAL_COMMUNITY): Payer: Self-pay | Admitting: Cardiovascular Disease

## 2020-06-23 DIAGNOSIS — I639 Cerebral infarction, unspecified: Secondary | ICD-10-CM

## 2020-06-23 NOTE — Progress Notes (Signed)
Beryle Beams, MD. Schedule an appointment as soon as possible for a visit in 4 week(s).   Specialty: Neurology Contact information: [address] [place] [address] [phone]  Per Dr. Tenny Craw referral placed to neurology.   pt is to have f/u in neuro for stroke

## 2020-06-29 ENCOUNTER — Ambulatory Visit: Payer: Self-pay

## 2020-06-29 NOTE — Telephone Encounter (Signed)
Patient called and says his BP is up. He says this morning at 0700 when he woke up his BP was 153/79. He says he took his BP medications as well as his other medicines. He says around noon he took his BP and it was 167/83. He says he took his 2nd dose of Metoprolol. He says he went to the gym and got on a treadmill. His HR was 77, but after a minute or two of walking, his HR registered 145. He says he stopped and got on another treadmill and same reading for his HR, so he says he came home. He says he's been checking his BP every few minutes and it's been ranging from the 130's-150's and his BP is not normally that high. I asked about symptoms, he denies any symptoms and says he feels fine, just concerned because he is just recently had a stroke. I advised the BP readings are not terribly high and advised not to take anymore Metoprolol, since he already too 2 doses today and his HR is already 50's-60's. I advised that he should never take more of his medications than is prescribed, advised the metoprolol lowers his BP and HR that if he takes more than is prescribed, he could start having symptoms and could possibly pass out from low HR/low BP. I advised to keep a record of his readings, the times of the BP, the times he takes his medications. I advised to call his cardiologist office tomorrow to let them know about his readings and they will be able to advise what he needs to do. Advised to go to the ED for any symptoms as per care advice, patient verbalized understanding.  Reason for Disposition . [1] Systolic BP  >= 130 OR Diastolic >= 80 AND [2] taking BP medications  Answer Assessment - Initial Assessment Questions 1. BLOOD PRESSURE: "What is the blood pressure?" "Did you take at least two measurements 5 minutes apart?"     147/73 2. ONSET: "When did you take your blood pressure?"     Now 3. HOW: "How did you obtain the blood pressure?" (e.g., visiting nurse, automatic home BP monitor)     Automatic  home BP monitor 4. HISTORY: "Do you have a history of high blood pressure?"     Yes 5. MEDICATIONS: "Are you taking any medications for blood pressure?" "Have you missed any doses recently?"     Yes taking and not missed any medications 6. OTHER SYMPTOMS: "Do you have any symptoms?" (e.g., headache, chest pain, blurred vision, difficulty breathing, weakness)     No 7. PREGNANCY: "Is there any chance you are pregnant?" "When was your last menstrual period?"     N/A  Protocols used: BLOOD PRESSURE - HIGH-A-AH

## 2020-07-04 ENCOUNTER — Ambulatory Visit (INDEPENDENT_AMBULATORY_CARE_PROVIDER_SITE_OTHER): Payer: Medicare Other

## 2020-07-04 DIAGNOSIS — I639 Cerebral infarction, unspecified: Secondary | ICD-10-CM

## 2020-07-04 DIAGNOSIS — I4891 Unspecified atrial fibrillation: Secondary | ICD-10-CM

## 2020-08-07 ENCOUNTER — Other Ambulatory Visit: Payer: Self-pay | Admitting: Internal Medicine

## 2020-08-07 DIAGNOSIS — I639 Cerebral infarction, unspecified: Secondary | ICD-10-CM

## 2020-08-07 DIAGNOSIS — I4891 Unspecified atrial fibrillation: Secondary | ICD-10-CM

## 2020-08-15 ENCOUNTER — Ambulatory Visit: Payer: Medicare Other | Admitting: Physician Assistant

## 2020-10-03 ENCOUNTER — Encounter: Payer: Self-pay | Admitting: Physician Assistant

## 2020-10-03 NOTE — Progress Notes (Deleted)
/   Cardiology Office Note    Date:  10/03/2020   ID:  Kyle Marks., DOB [birth year], MRN [id number]  PCP:  Salome Holmes, MD  Cardiologist:  None  Electrophysiologist:  None   Chief Complaint: ***  History of Present Illness:   Kyle Marks. is a 65 y.o. male with history of HTN, DM, HLD, PVD s/p iliac stent, obesity, CKD stage IIIa  He follows with Dr. Tenny Craw for cardiology care with prior iliac stent, details not available at this time. He had prior monitor in 05/2019 showing rare PAC, PVC, NSR/SB/ST average 70bm range 41-139bpm. PMH states h/o CAD with stents ***. In 05/2020 he had a stroke with unrevealing echo, EF 60-65, mild LVH. Carotid duplex showed no significant narrowing. Neurology changed Plavix to Brilinta. Dr. Tenny Craw arranged OP TEE showing EF 55-60%, no LAA thrombus, mild-moderate TR, mild plaque in thoracic aorta. Event monitor read 07/2020 showed NSR, sinus tach, no significant arrhythmias.  Bmet/mg Refer to EP for loop  PVD with HLD goal LDL <70 Hypokalemia History of stroke Essential HTN Tricuspid regurgitation    Labwork independently reviewed: 05/2020 LDL 30, trig 118, A1c 6.6, K 3.4, Cr 1.35 (prev 1.89), CBC ok, LFTs ok 03/2020 TSH wnl   Past Medical History:  Diagnosis Date   Coronary artery disease    pt reports he has stents   Diabetes mellitus without complication (HCC)    Hyperlipidemia    Hypertension     Past Surgical History:  Procedure Laterality Date   BUBBLE STUDY  06/21/2020   Procedure: BUBBLE STUDY;  Surgeon: Vesta Mixer, MD;  Location: [facility] ENDOSCOPY;  Service: Cardiovascular;;   CARDIAC SURGERY     TEE WITHOUT CARDIOVERSION N/A 06/21/2020   Procedure: TRANSESOPHAGEAL ECHOCARDIOGRAM (TEE);  Surgeon: Elease Hashimoto Deloris Ping, MD;  Location: [facility] ENDOSCOPY;  Service: Cardiovascular;  Laterality: N/A;    Current Medications: No outpatient medications have been marked as taking for the 10/04/20 encounter (Appointment) with Laurann Montana, PA-C.   ***   Allergies:   Patient has no known allergies.   Social History   Socioeconomic History   Marital status: Divorced    Spouse name: Not on file   Number of children: Not on file   Years of education: Not on file   Highest education level: Not on file  Occupational History   Not on file  Tobacco Use   Smoking status: Former    Pack years: 0.00    Types: Cigarettes    Quit date: 11/24/2017    Years since quitting: 2.8   Smokeless tobacco: Never  Vaping Use   Vaping Use: Never used  Substance and Sexual Activity   Alcohol use: Not Currently   Drug use: Never   Sexual activity: Not on file  Other Topics Concern   Not on file  Social History Narrative   Not on file   Social Determinants of Health   Financial Resource Strain: Not on file  Food Insecurity: Not on file  Transportation Needs: Not on file  Physical Activity: Not on file  Stress: Not on file  Social Connections: Not on file     Family History:  The patient's ***family history includes Alzheimer's disease in his mother; CAD in his brother; Heart attack in his father; Hypertension in his brother.  ROS:   Please see the history of present illness. Otherwise, review of systems is positive for ***.  All other systems are reviewed and otherwise negative.  EKGs/Labs/Other Studies Reviewed:    Studies reviewed are outlined and summarized above. Reports included below if pertinent.  TEE 06/2020   1. Left ventricular ejection fraction, by estimation, is 55 to 60%. The  left ventricle has normal function.   2. Right ventricular systolic function is normal. The right ventricular  size is normal.   3. No left atrial/left atrial appendage thrombus was detected.   4. The mitral valve is normal in structure. Trivial mitral valve  regurgitation.   5. Tricuspid valve regurgitation is mild to moderate.   6. The aortic valve is normal in structure. Aortic valve regurgitation is  not visualized.    7. There is mild (Grade II) plaque in the thoracic aorta .   2D echo 05/2020  1. Left ventricular ejection fraction, by estimation, is 60 to 65%. The  left ventricle has normal function. The left ventricle has no regional  wall motion abnormalities. There is mild left ventricular hypertrophy.  Left ventricular diastolic parameters  were normal.   2. Right ventricular systolic function is normal. The right ventricular  size is normal.   3. The mitral valve is normal in structure. No evidence of mitral valve  regurgitation. No evidence of mitral stenosis.   4. The aortic valve is tricuspid. There is mild calcification of the  aortic valve. Aortic valve regurgitation is not visualized. Mild to  moderate aortic valve sclerosis/calcification is present, without any  evidence of aortic stenosis.   5. The inferior vena cava is normal in size with greater than 50%  respiratory variability, suggesting right atrial pressure of 3 mmHg.     EKG:  EKG is ordered today, personally reviewed, demonstrating ***  Recent Labs: 04/13/2020: TSH 3.389 06/01/2020: ALT 25; Hemoglobin 13.7; Platelets 193 06/02/2020: BUN 17; Creatinine, Ser 1.35; Potassium 3.4; Sodium 139  Recent Lipid Panel    Component Value Date/Time   CHOL 83 06/02/2020 0449   TRIG 118 06/02/2020 0449   HDL 29 (L) 06/02/2020 0449   CHOLHDL 2.9 06/02/2020 0449   VLDL 24 06/02/2020 0449   LDLCALC 30 06/02/2020 0449   LDLCALC 89 04/23/2019 1033    PHYSICAL EXAM:    VS:  There were no vitals taken for this visit.  BMI: There is no height or weight on file to calculate BMI.  GEN: Well nourished, well developed male in no acute distress HEENT: normocephalic, atraumatic Neck: no JVD, carotid bruits, or masses Cardiac: ***RRR; no murmurs, rubs, or gallops, no edema  Respiratory:  clear to auscultation bilaterally, normal work of breathing GI: soft, nontender, nondistended, + BS MS: no deformity or atrophy Skin: warm and dry, no  rash Neuro:  Alert and Oriented x 3, Strength and sensation are intact, follows commands Psych: euthymic mood, full affect  Wt Readings from Last 3 Encounters:  06/21/20 237 lb (107.5 kg)  06/14/20 243 lb 9.6 oz (110.5 kg)  06/01/20 247 lb 9.6 oz (112.3 kg)     ASSESSMENT & PLAN:     Disposition: F/u with ***   Medication Adjustments/Labs and Tests Ordered: Current medicines are reviewed at length with the patient today.  Concerns regarding medicines are outlined above. Medication changes, Labs and Tests ordered today are summarized above and listed in the Patient Instructions accessible in Encounters.   Signed, Laurann Montana, PA-C  10/03/2020 8:16 AM    [facility] [address] [place], [place], [place]  [address] Phone: [phone]; Fax: [phone]

## 2020-10-04 ENCOUNTER — Ambulatory Visit: Payer: Medicare Other | Admitting: Physician Assistant

## 2020-10-04 DIAGNOSIS — I361 Nonrheumatic tricuspid (valve) insufficiency: Secondary | ICD-10-CM

## 2020-10-04 DIAGNOSIS — Z8673 Personal history of transient ischemic attack (TIA), and cerebral infarction without residual deficits: Secondary | ICD-10-CM

## 2020-10-04 DIAGNOSIS — E785 Hyperlipidemia, unspecified: Secondary | ICD-10-CM

## 2020-10-04 DIAGNOSIS — E876 Hypokalemia: Secondary | ICD-10-CM

## 2020-10-04 DIAGNOSIS — I10 Essential (primary) hypertension: Secondary | ICD-10-CM

## 2020-10-04 DIAGNOSIS — I739 Peripheral vascular disease, unspecified: Secondary | ICD-10-CM

## 2021-03-22 ENCOUNTER — Other Ambulatory Visit: Payer: Self-pay | Admitting: Internal Medicine

## 2021-10-24 ENCOUNTER — Other Ambulatory Visit (HOSPITAL_COMMUNITY): Payer: Self-pay | Admitting: Family Medicine

## 2021-10-24 DIAGNOSIS — R609 Edema, unspecified: Secondary | ICD-10-CM

## 2021-11-07 ENCOUNTER — Ambulatory Visit (HOSPITAL_COMMUNITY)
Admission: RE | Admit: 2021-11-07 | Discharge: 2021-11-07 | Disposition: A | Discharge status: Home / Self Care | Payer: Medicare Other | Source: Ambulatory Visit | Attending: Family Medicine | Admitting: Family Medicine

## 2021-11-07 DIAGNOSIS — I10 Essential (primary) hypertension: Secondary | ICD-10-CM | POA: Diagnosis not present

## 2021-11-07 DIAGNOSIS — Z87891 Personal history of nicotine dependence: Secondary | ICD-10-CM | POA: Diagnosis not present

## 2021-11-07 DIAGNOSIS — E119 Type 2 diabetes mellitus without complications: Secondary | ICD-10-CM | POA: Diagnosis not present

## 2021-11-07 DIAGNOSIS — E785 Hyperlipidemia, unspecified: Secondary | ICD-10-CM | POA: Insufficient documentation

## 2021-11-07 DIAGNOSIS — R609 Edema, unspecified: Secondary | ICD-10-CM | POA: Diagnosis not present

## 2021-11-07 LAB — ECHOCARDIOGRAM COMPLETE
Area-P 1/2: 2.48 cm2
S' Lateral: 3.2 cm

## 2021-11-07 NOTE — Progress Notes (Signed)
*  PRELIMINARY RESULTS* Echocardiogram 2D Echocardiogram has been performed.  Stacey Drain 11/07/2021, 12:18 PM

## 2022-03-08 ENCOUNTER — Other Ambulatory Visit: Payer: Self-pay | Admitting: Internal Medicine

## 2022-06-23 IMAGING — CT CT HEAD CODE STROKE
3 series · 16 of 47 positions shown, 19 images · non-contrast
Comparison: None.

CLINICAL DATA: Code stroke.  Right arm numbness

EXAM:
CT HEAD WITHOUT CONTRAST
TECHNIQUE: Contiguous axial images were obtained from the base of the skull
through the vertex without intravenous contrast.

[Series 2: head w o · axial · 0.47mm/px · z∈[-34,+116]mm · 10 of 36 slices shown, 13 images]
[im 3/36  brain]
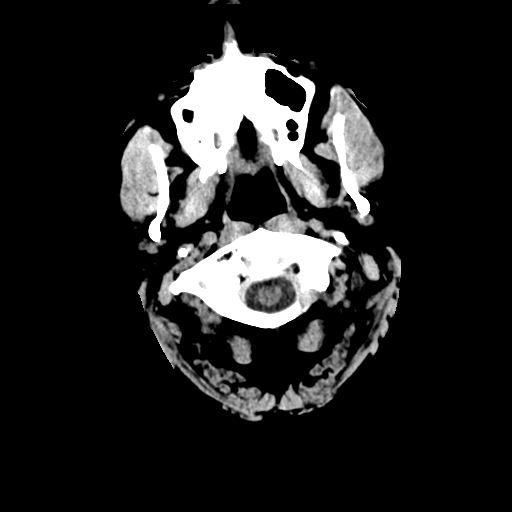
[im 3/36  bone]
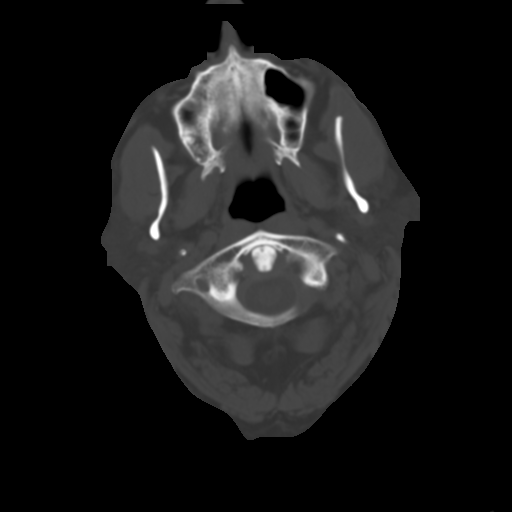
[im 7/36  brain]
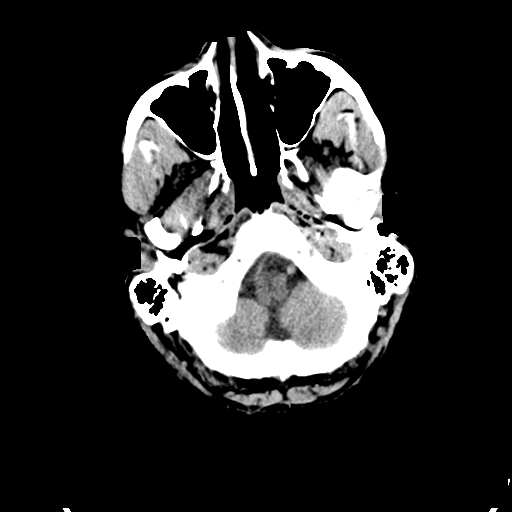
[im 10/36  brain]
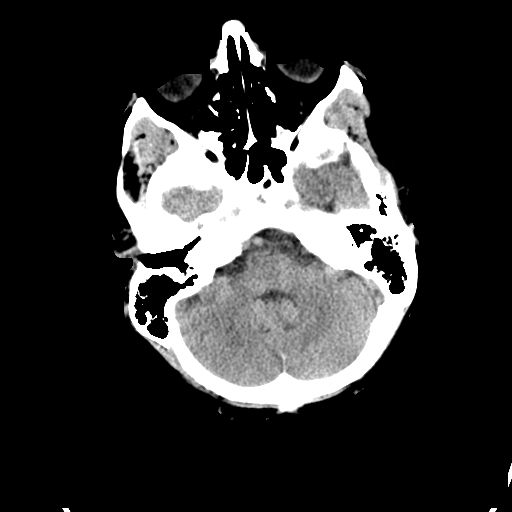
[im 13/36  brain]
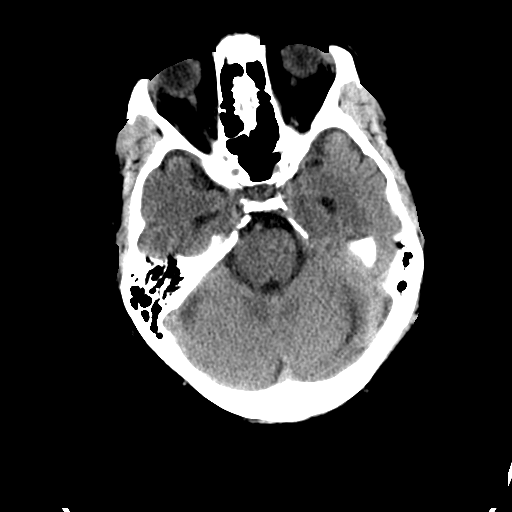
[im 16/36  brain]
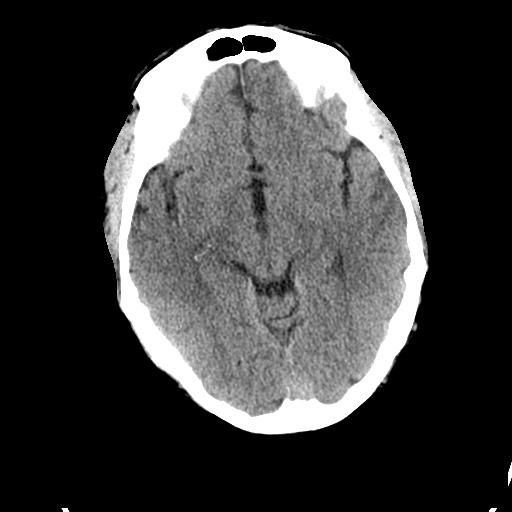
[im 16/36  bone]
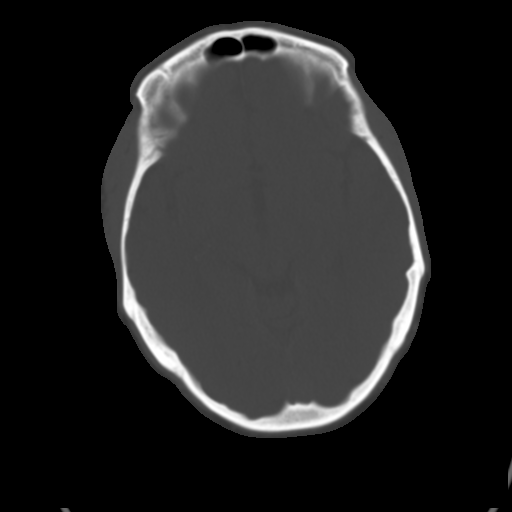
[im 20/36  brain]
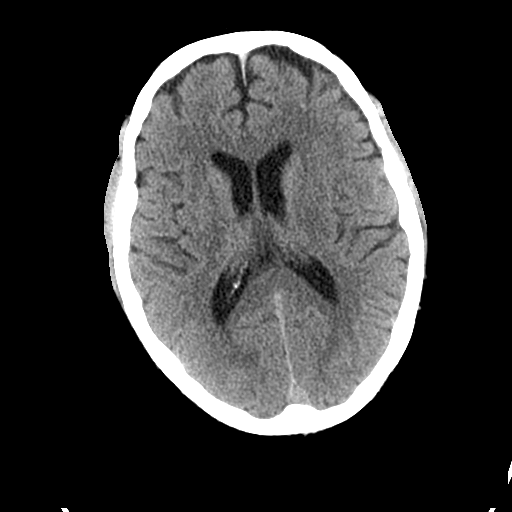
[im 23/36  brain]
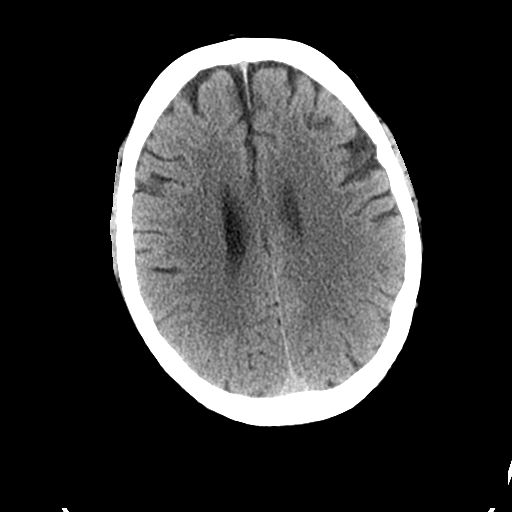
[im 27/36  brain]
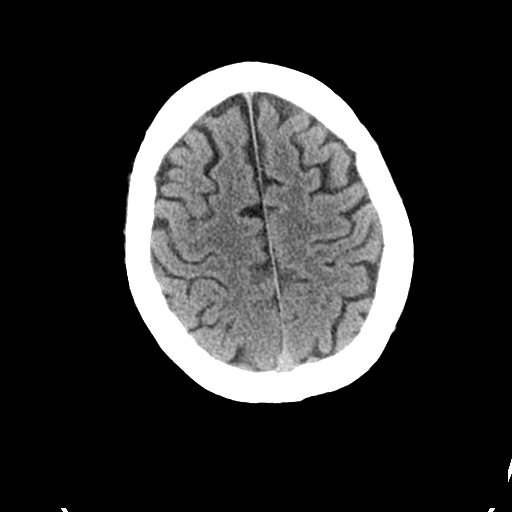
[im 29/36  brain]
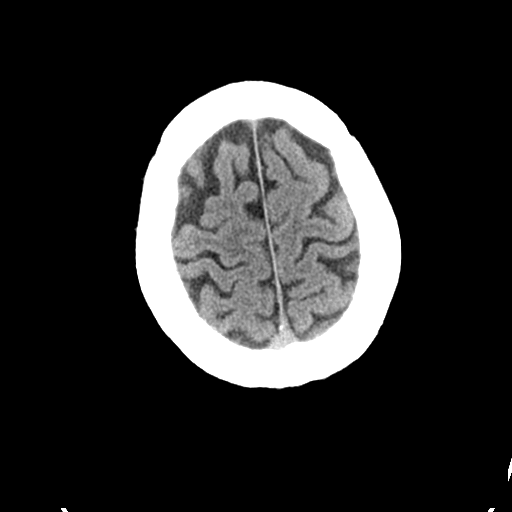
[im 29/36  bone]
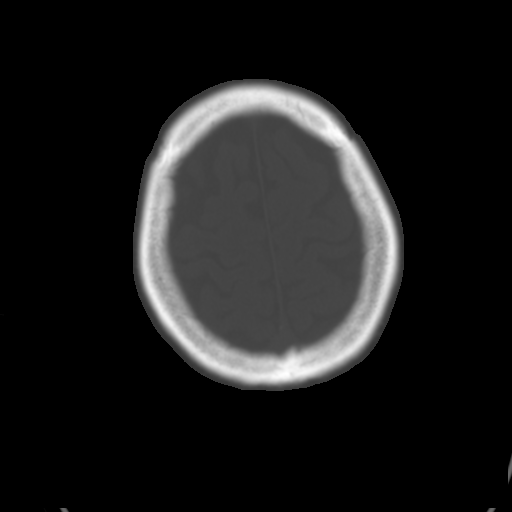
[im 33/36  brain]
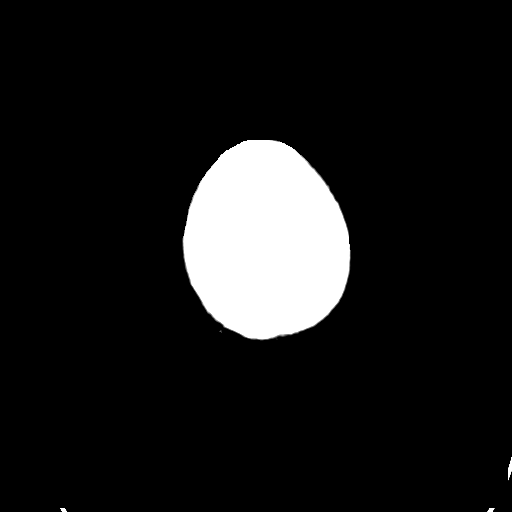

[Series 4: coronal soft · coronal · 0.35mm/px · 3 of 71 slices shown]
[im 24/71  brain]
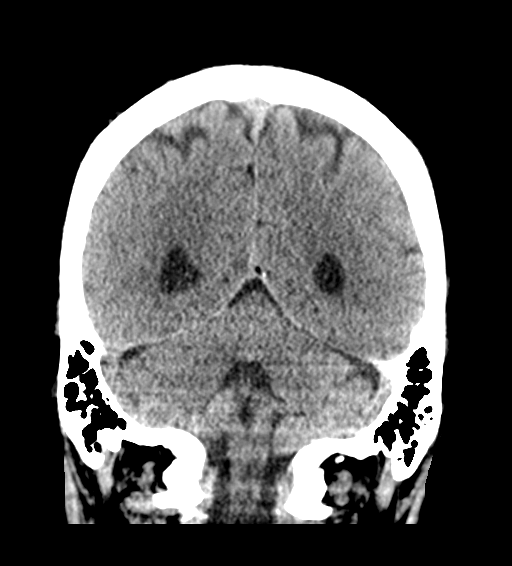
[im 32/71  brain]
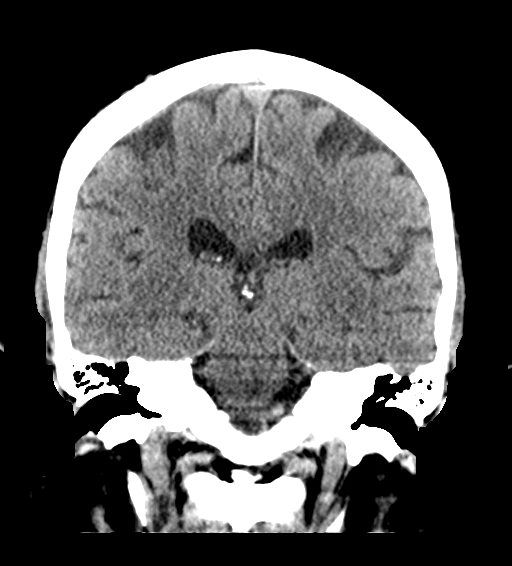
[im 39/71  brain]
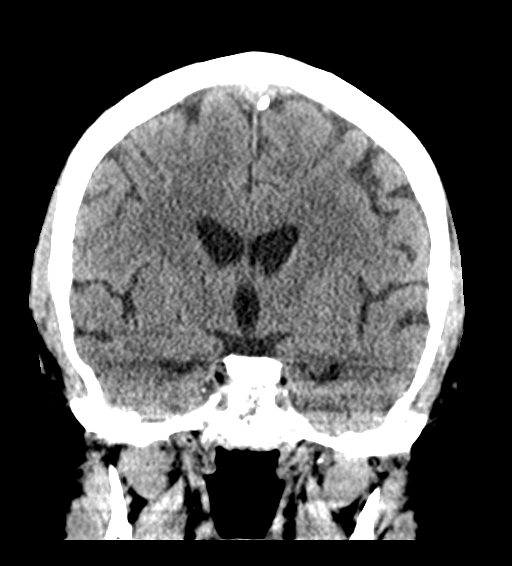

[Series 5: sagittal soft · sagittal · 0.37mm/px · 3 of 61 slices shown]
[im 21/61  brain]
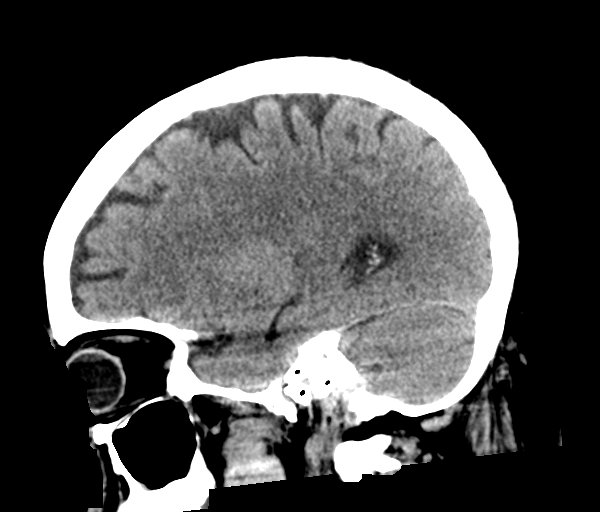
[im 31/61  brain]
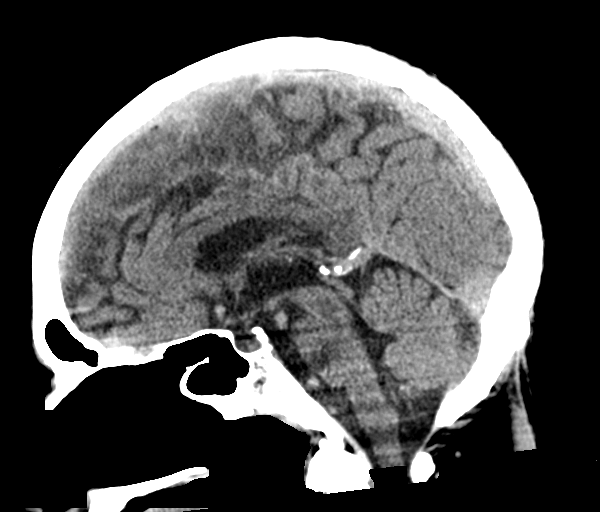
[im 41/61  brain]
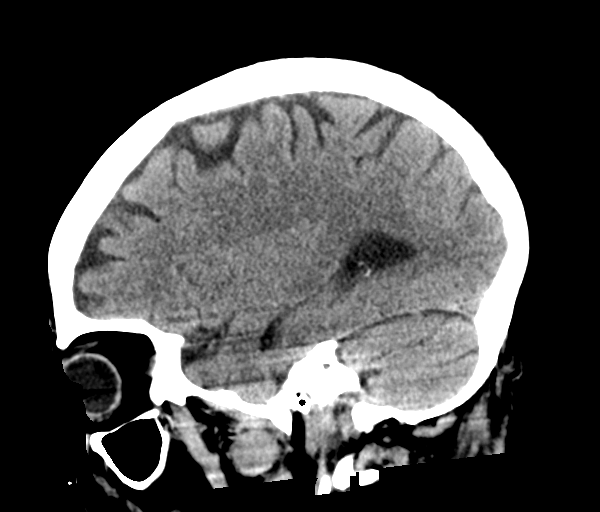

[16 of 47 positions shown; findings below may reference images not displayed]

FINDINGS: Brain: Normal appearance without evidence of atrophy, old or acute
infarction, mass lesion, hemorrhage, hydrocephalus or extra-axial
collection.

Vascular: There is atherosclerotic calcification of the major
vessels at the base of the brain.

Skull: Negative

Sinuses/Orbits: Minimal ethmoid sinus opacification. Orbits
negative.

Other: None

ASPECTS (Alberta Stroke Program Early CT Score)

- Ganglionic level infarction (caudate, lentiform nuclei, internal
capsule, insula, M1-M3 cortex): 7

- Supraganglionic infarction (M4-M6 cortex): 3

Total score (0-10 with 10 being normal): 10
IMPRESSION: 1. Normal head CT for age.
2. ASPECTS is 10.
3. These results were called by telephone at the time of
interpretation on 06/01/2020 at [DATE] to provider FELNER CEOLA ,
who verbally acknowledged these results.

## 2022-06-24 IMAGING — US US CAROTID DUPLEX BILAT
1 series · 13 of 24 positions shown · non-contrast
Comparison: None.

CLINICAL DATA: CVA

Hypertension
Syncope
CAD
Hyperlipidemia
Diabetes
Prior tobacco use
EXAM:
BILATERAL CAROTID DUPLEX ULTRASOUND
TECHNIQUE: Gray scale imaging, color Doppler and duplex ultrasound were
performed of bilateral carotid and vertebral arteries in the neck.

[Series 1: us carotid bilateral · 13 of 68 slices shown]
[im 1/68]
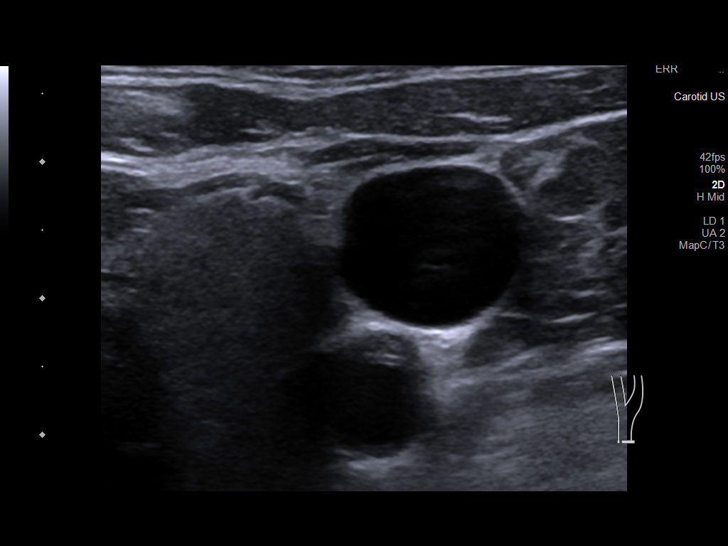
[im 6/68]
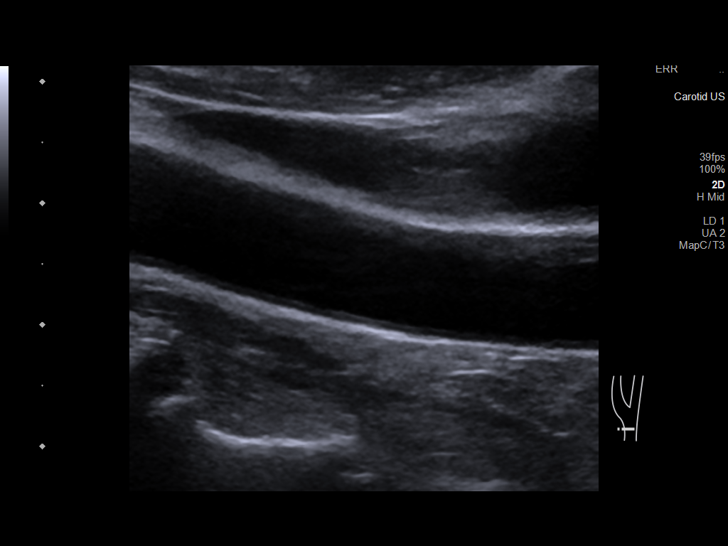
[im 12/68]
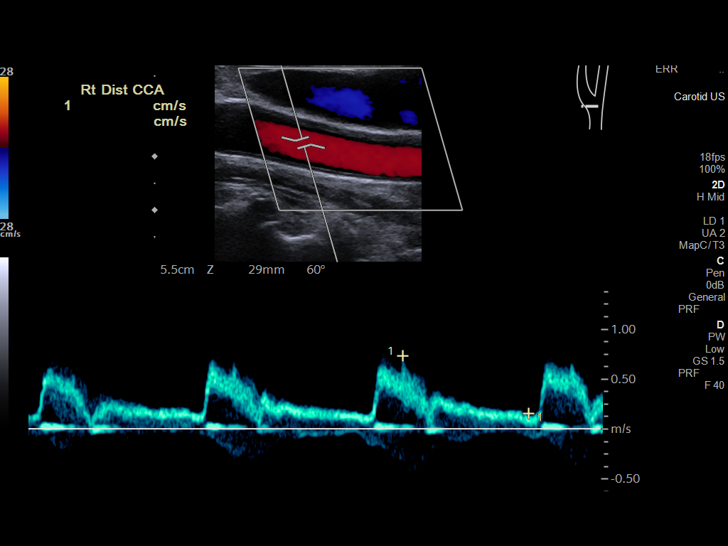
[im 18/68]
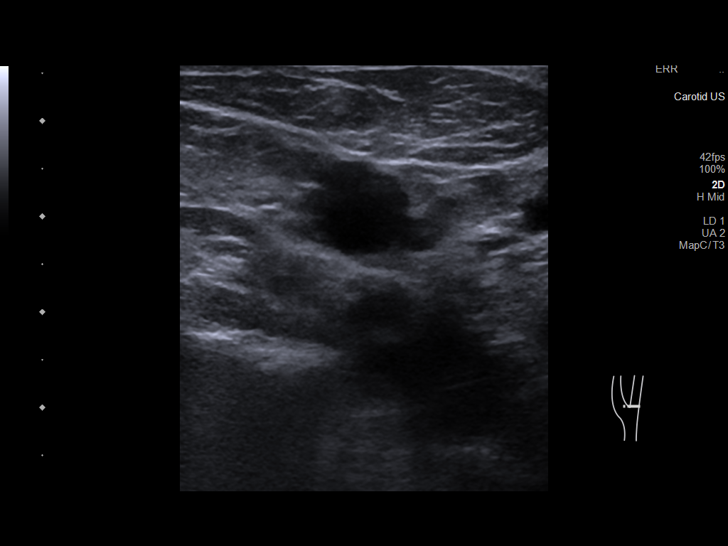
[im 24/68]
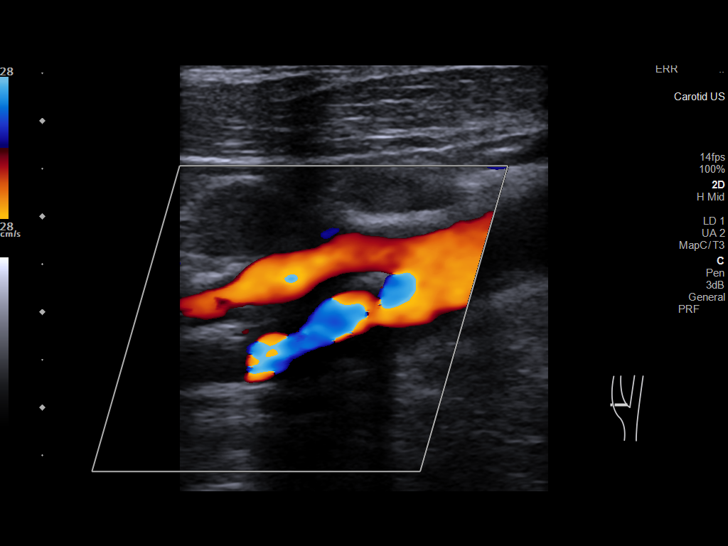
[im 30/68]
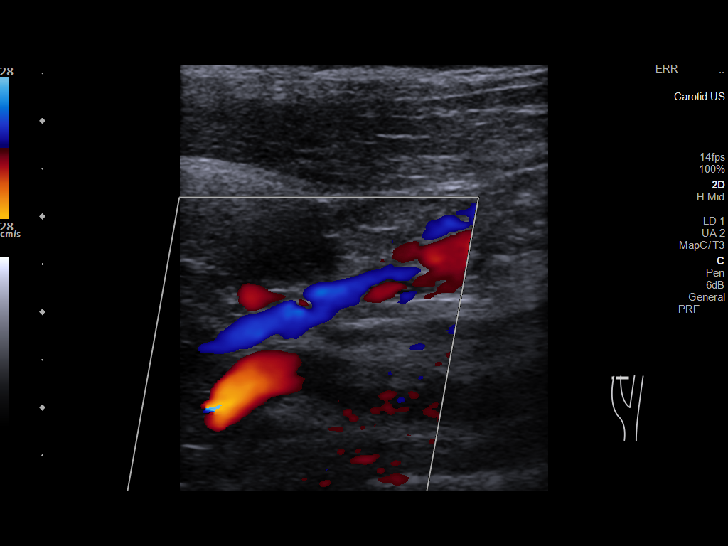
[im 35/68]
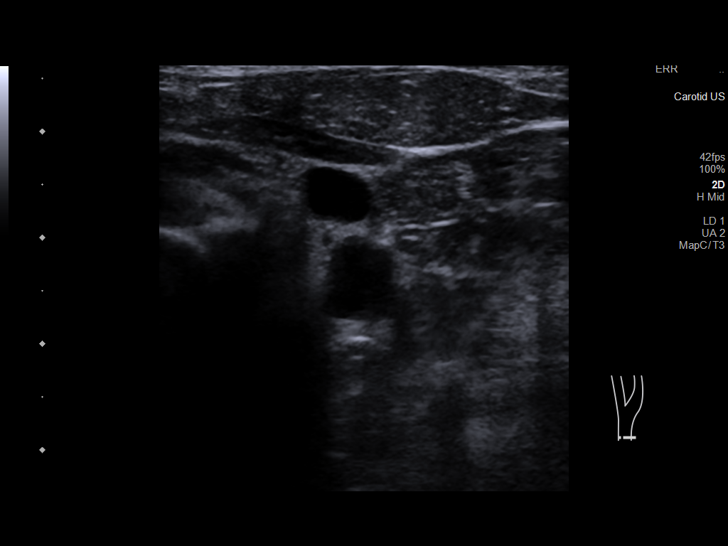
[im 38/68]
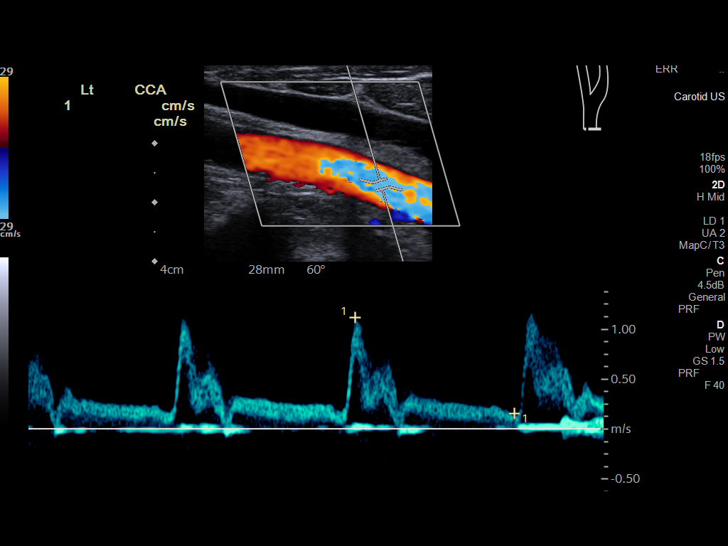
[im 44/68]
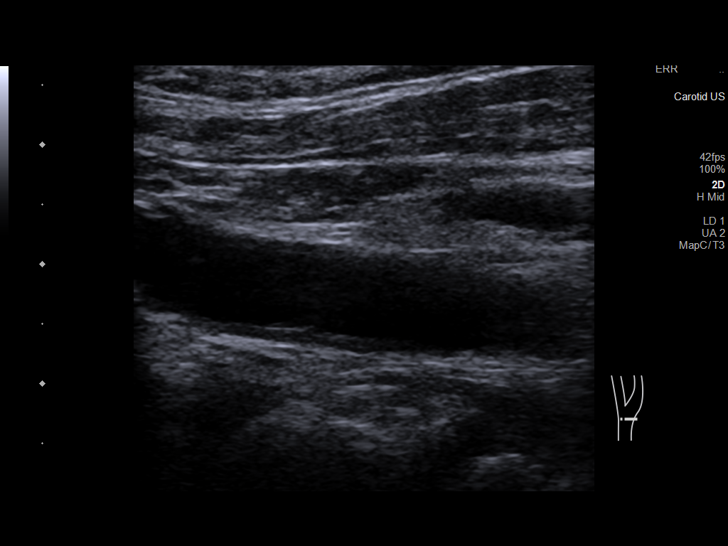
[im 50/68]
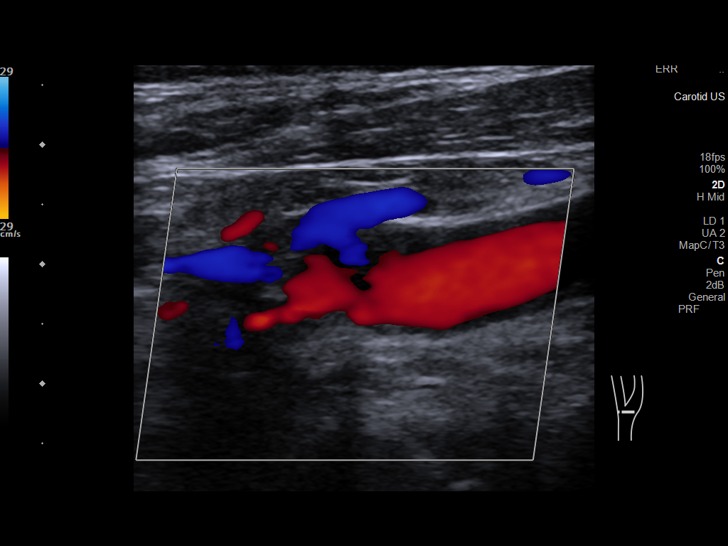
[im 56/68]
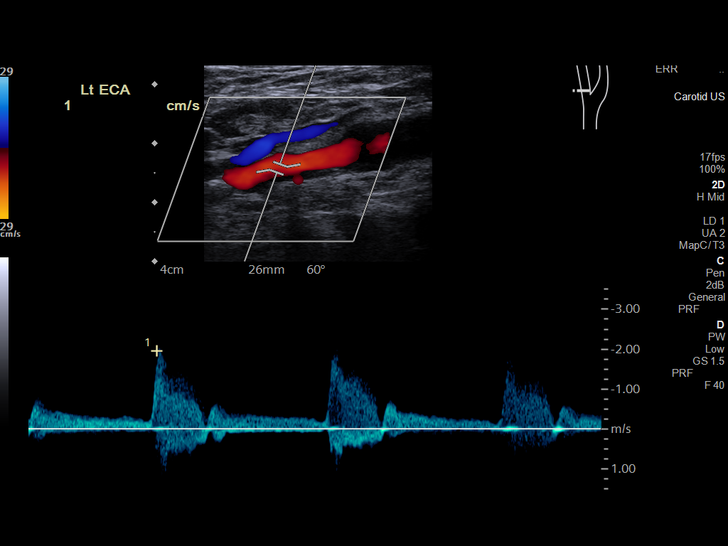
[im 62/68]
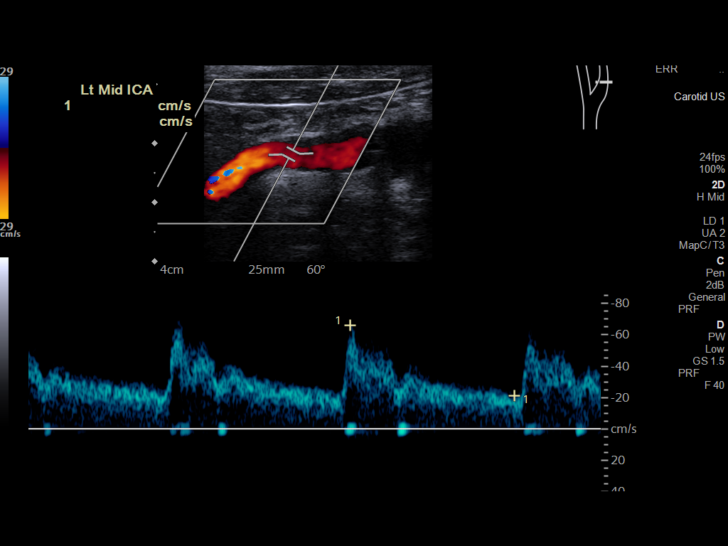
[im 68/68]
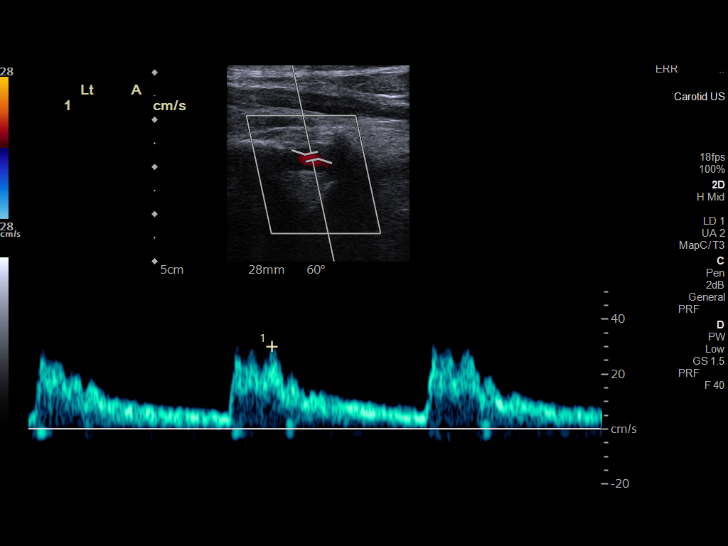

[13 of 24 positions shown; findings below may reference images not displayed]

FINDINGS: Criteria: Quantification of carotid stenosis is based on velocity
parameters that correlate the residual internal carotid diameter
with NASCET-based stenosis levels, using the diameter of the distal
internal carotid lumen as the denominator for stenosis measurement.

The following velocity measurements were obtained:

RIGHT

ICA: 117/40 cm/sec

CCA: 80/20 cm/sec

SYSTOLIC ICA/CCA RATIO:

ECA: 165 cm/sec

LEFT

ICA: 91/28 cm/sec

CCA: 74/18 cm/sec

SYSTOLIC ICA/CCA RATIO:

ECA: 196 cm/sec

RIGHT CAROTID ARTERY: Minimal scattered atherosclerotic plaque of
the distal common and proximal internal carotid arteries without
flow-limiting stenosis.

RIGHT VERTEBRAL ARTERY:  Antegrade flow

LEFT CAROTID ARTERY: No significant atherosclerotic disease of the
left common or internal carotid artery. No significant stenosis.

LEFT VERTEBRAL ARTERY:  Antegrade flow
IMPRESSION: No significant narrowing of internal carotid arteries

## 2023-05-22 LAB — VITAMIN D 25 HYDROXY (VIT D DEFICIENCY, FRACTURES): Vit D, 25-Hydroxy: 19.3

## 2023-05-22 LAB — COMPREHENSIVE METABOLIC PANEL WITH GFR: eGFR: 30

## 2023-05-22 LAB — BASIC METABOLIC PANEL WITH GFR
BUN: 21 (ref 4–21)
Creatinine: 2.3 — AB (ref 0.6–1.3)
Glucose: 258

## 2023-05-22 LAB — HEMOGLOBIN A1C: Hemoglobin A1C: 9.9

## 2023-05-22 LAB — VITAMIN B12: Vitamin B-12: 141

## 2023-05-22 LAB — MICROALBUMIN / CREATININE URINE RATIO: Microalb Creat Ratio: >4500

## 2023-10-19 LAB — LAB REPORT - SCANNED
A1c: 5.2
Creatinine, POC: 76 mg/dL
EGFR: 25
Free T4: 1.04 ng/dL
Microalb Creat Ratio: 446
Microalbumin, Urine: 339
TSH: 3.23 (ref 0.41–5.90)

## 2023-10-27 NOTE — Patient Instructions (Signed)

## 2023-10-30 ENCOUNTER — Encounter: Payer: Self-pay | Admitting: Nurse Practitioner

## 2023-10-30 ENCOUNTER — Encounter: Payer: Self-pay | Admitting: Family Medicine

## 2023-10-30 ENCOUNTER — Telehealth: Payer: Self-pay | Admitting: Nutrition

## 2023-10-30 ENCOUNTER — Ambulatory Visit: Payer: Self-pay | Admitting: Nurse Practitioner

## 2023-10-30 VITALS — BP 118/82 | HR 60 | Ht 71.0 in | Wt 217.6 lb

## 2023-10-30 DIAGNOSIS — E1165 Type 2 diabetes mellitus with hyperglycemia: Secondary | ICD-10-CM | POA: Diagnosis not present

## 2023-10-30 DIAGNOSIS — E559 Vitamin D deficiency, unspecified: Secondary | ICD-10-CM

## 2023-10-30 DIAGNOSIS — E782 Mixed hyperlipidemia: Secondary | ICD-10-CM

## 2023-10-30 DIAGNOSIS — Z7984 Long term (current) use of oral hypoglycemic drugs: Secondary | ICD-10-CM | POA: Diagnosis not present

## 2023-10-30 DIAGNOSIS — I10 Essential (primary) hypertension: Secondary | ICD-10-CM

## 2023-10-30 NOTE — Telephone Encounter (Signed)
 Opened in error. Documentation already done.

## 2023-10-30 NOTE — Progress Notes (Signed)
 Endocrinology Consult Note       10/30/2023, 2:42 PM   Subjective:    Patient ID: Kyle KATHEE Margrette Mickey., male    DOB: 1955/11/07.  Kyle KATHEE Margrette Mickey. is being seen in consultation for management of currently uncontrolled symptomatic diabetes requested by  Jesusa Caroline, MD.   Past Medical History:  Diagnosis Date   Chronic kidney disease, stage 3a (HCC)    Coronary artery disease    pt reports he has stents   Diabetes mellitus without complication (HCC)    Hyperlipidemia    Hypertension    Hypokalemia    PVD (peripheral vascular disease) (HCC)    Stroke [facility])    Tricuspid regurgitation     Past Surgical History:  Procedure Laterality Date   BUBBLE STUDY  06/21/2020   Procedure: BUBBLE STUDY;  Surgeon: Alveta Aleene PARAS, MD;  Location: [facility] ENDOSCOPY;  Service: Cardiovascular;;   CARDIAC SURGERY     TEE WITHOUT CARDIOVERSION N/A 06/21/2020   Procedure: TRANSESOPHAGEAL ECHOCARDIOGRAM (TEE);  Surgeon: Alveta, Aleene PARAS, MD;  Location: [facility] ENDOSCOPY;  Service: Cardiovascular;  Laterality: N/A;    Social History   Socioeconomic History   Marital status: Divorced    Spouse name: Not on file   Number of children: Not on file   Years of education: Not on file   Highest education level: Not on file  Occupational History   Not on file  Tobacco Use   Smoking status: Former    Current packs/day: 0.00    Types: Cigarettes    Quit date: 11/24/2017    Years since quitting: 5.9   Smokeless tobacco: Never  Vaping Use   Vaping status: Never Used  Substance and Sexual Activity   Alcohol use: Not Currently   Drug use: Never   Sexual activity: Not on file  Other Topics Concern   Not on file  Social History Narrative   Not on file   Social Drivers of Health   Financial Resource Strain: Not on file  Food Insecurity: Not on file  Transportation Needs: Not on file  Physical Activity: Not on file  Stress: Not  on file  Social Connections: Not on file    Family History  Problem Relation Age of Onset   Alzheimer's disease Mother    Heart attack Father    Hypertension Brother    CAD Brother     Outpatient Encounter Medications as of 10/30/2023  Medication Sig   amLODipine  (NORVASC ) 10 MG tablet Take 1 tablet (10 mg total) by mouth daily.   aspirin  EC 81 MG EC tablet Take 1 tablet (81 mg total) by mouth daily. Swallow whole.   clopidogrel  (PLAVIX ) 75 MG tablet Take 75 mg by mouth daily.   cyanocobalamin (VITAMIN B12) 1000 MCG tablet Take 1,000 mcg by mouth daily.   ezetimibe  (ZETIA ) 10 MG tablet TAKE 1 TABLET BY MOUTH EVERY DAY   glimepiride (AMARYL) 4 MG tablet Take 4 mg by mouth at bedtime.    hydrALAZINE  (APRESOLINE ) 50 MG tablet Take 50 mg by mouth at bedtime.   losartan  (COZAAR ) 100 MG tablet Take 100 mg by mouth daily.   losartan -hydrochlorothiazide  (HYZAAR) 100-12.5 MG tablet Take 1  tablet by mouth daily.   metoprolol  tartrate (LOPRESSOR ) 50 MG tablet Take 50 mg by mouth 2 (two) times daily.   omega-3 acid ethyl esters (LOVAZA ) 1 g capsule Take 1 capsule (1 g total) by mouth 2 (two) times daily.   rosuvastatin  (CRESTOR ) 10 MG tablet TAKE 1 TABLET BY MOUTH EVERY DAY   rosuvastatin  (CRESTOR ) 10 MG tablet Take 10 mg by mouth daily.   VITAMIN D  PO Take 5,000 Units by mouth daily.   VITAMIN D  PO Take 1,000 Units by mouth daily.   [DISCONTINUED] JARDIANCE 10 MG TABS tablet Take 10 mg by mouth daily. (Patient taking differently: Take 10 mg by mouth daily. Patient is currently taking two 10 mg to equal 20 mg, once completed he will start the 25 mg of Jardiance.)   [DISCONTINUED] metFORMIN (GLUCOPHAGE-XR) 500 MG 24 hr tablet Take 1,000 mg by mouth 2 (two) times daily.   JARDIANCE 25 MG TABS tablet Take 25 mg by mouth daily. (Patient not taking: Reported on 10/30/2023)   No facility-administered encounter medications on file as of 10/30/2023.    ALLERGIES: No Known Allergies  VACCINATION  STATUS:  There is no immunization history on file for this patient.  Diabetes He presents for his initial diabetic visit. He has type 2 diabetes mellitus. Onset time: diagnosed at approx age of 60. His disease course has been improving. There are no hypoglycemic associated symptoms. There are no diabetic associated symptoms. There are no hypoglycemic complications. Symptoms are stable. Diabetic complications include a CVA, heart disease, nephropathy and PVD. Risk factors for coronary artery disease include diabetes mellitus, family history, male sex, obesity, hypertension and sedentary lifestyle. Current diabetic treatment includes oral agent (monotherapy). He is compliant with treatment most of the time. His weight is decreasing steadily. He is following a generally healthy diet. When asked about meal planning, he reported none. He has had a previous visit with a dietitian (Previously saw Santana for Diabetes education and nutrition education). He participates in exercise daily. His home blood glucose trend is decreasing steadily. (He presents today for his consultation with no meter or logs to review, he does monitor glucose twice daily at home.  His most recent A1c on 7/6 was 5.2%, improving from last A1c of 9.9%.  He has drastically changed his diet.  He drinks only water with lemon, and eats 2-3 meals per day.  He walks 1 mile every day.  He is UTD on eye exam, does not see podiatry routinely.) An ACE inhibitor/angiotensin II receptor blocker is being taken. He does not see a podiatrist.Eye exam is current.     Review of systems  Constitutional: + Minimally fluctuating body weight, current Body mass index is 30.35 kg/m., no fatigue, no subjective hyperthermia, no subjective hypothermia Eyes: no blurry vision, no xerophthalmia ENT: no sore throat, no nodules palpated in throat, no dysphagia/odynophagia, no hoarseness Cardiovascular: no chest pain, no shortness of breath, no palpitations, no leg  swelling Respiratory: no cough, no shortness of breath Gastrointestinal: no nausea/vomiting/diarrhea Musculoskeletal: no muscle/joint aches Skin: no rashes, no hyperemia Neurological: no tremors, no numbness, no tingling, no dizziness Psychiatric: no depression, no anxiety  Objective:     BP 118/82 (BP Location: Left Arm, Patient Position: Sitting, Cuff Size: Large)   Pulse 60   Ht 5' 11 (1.803 m)   Wt 217 lb 9.6 oz (98.7 kg)   BMI 30.35 kg/m   Wt Readings from Last 3 Encounters:  10/30/23 217 lb 9.6 oz (98.7 kg)  06/21/20  237 lb (107.5 kg)  06/14/20 243 lb 9.6 oz (110.5 kg)     BP Readings from Last 3 Encounters:  10/30/23 118/82  06/21/20 124/69  06/14/20 136/70     Physical Exam- Limited  Constitutional:  Body mass index is 30.35 kg/m. , not in acute distress, normal state of mind Eyes:  EOMI, no exophthalmos Neck: Supple Cardiovascular: RRR, no murmurs, rubs, or gallops, no edema Respiratory: Adequate breathing efforts, no crackles, rales, rhonchi, or wheezing Musculoskeletal: no gross deformities, strength intact in all four extremities, no gross restriction of joint movements Skin:  no rashes, no hyperemia Neurological: no tremor with outstretched hands   Diabetic Foot Exam - Simple   No data filed      CMP ( most recent) CMP     Component Value Date/Time   NA 139 06/02/2020 0449   K 3.4 (L) 06/02/2020 0449   CL 108 06/02/2020 0449   CO2 22 06/02/2020 0449   GLUCOSE 100 (H) 06/02/2020 0449   BUN 21 05/22/2023 0000   CREATININE 2.3 (A) 05/22/2023 0000   CREATININE 1.35 (H) 06/02/2020 0449   CREATININE 1.43 (H) 04/23/2019 1033   CALCIUM  8.8 (L) 06/02/2020 0449   PROT 6.6 06/01/2020 1706   ALBUMIN 3.9 06/01/2020 1706   AST 21 06/01/2020 1706   ALT 25 06/01/2020 1706   ALKPHOS 39 06/01/2020 1706   BILITOT 0.7 06/01/2020 1706   EGFR 25.0 10/19/2023 1009   EGFR 30 05/22/2023 0000   GFRNONAA 59 (L) 06/02/2020 0449     Diabetic Labs (most  recent): Lab Results  Component Value Date   HGBA1C 9.9 05/22/2023   HGBA1C 6.6 (H) 06/02/2020   HGBA1C 9.4 (H) 04/13/2020     Lipid Panel ( most recent) Lipid Panel     Component Value Date/Time   CHOL 83 06/02/2020 0449   TRIG 118 06/02/2020 0449   HDL 29 (L) 06/02/2020 0449   CHOLHDL 2.9 06/02/2020 0449   VLDL 24 06/02/2020 0449   LDLCALC 30 06/02/2020 0449   LDLCALC 89 04/23/2019 1033      Lab Results  Component Value Date   TSH 3.23 10/19/2023   TSH 3.389 04/13/2020   FREET4 1.04 10/19/2023           Assessment & Plan:   1) Type 2 diabetes mellitus with hyperglycemia, without long-term current use of insulin  (HCC) (Primary)    - Kyle KATHEE Margrette Mickey. has currently uncontrolled symptomatic type 2 DM since 68 years of age, with most recent A1c of 5.2 %.   -Recent labs reviewed.  - I had a long discussion with him about the progressive nature of diabetes and the pathology behind its complications. -his diabetes is complicated by CAD, CVA, CKD stage 3b, PAD and he remains at a high risk for more acute and chronic complications which include CAD, CVA, CKD, retinopathy, and neuropathy. These are all discussed in detail with him.  The following Lifestyle Medicine recommendations according to American College of Lifestyle Medicine [facility]) were discussed and offered to patient and he agrees to start the journey:  A. Whole Foods, Plant-based plate comprising of fruits and vegetables, plant-based proteins, whole-grain carbohydrates was discussed in detail with the patient.   A list for source of those nutrients were also provided to the patient.  Patient will use only water or unsweetened tea for hydration. B.  The need to stay away from risky substances including alcohol, smoking; obtaining 7 to 9 hours of restorative sleep, at least  150 minutes of moderate intensity exercise weekly, the importance of healthy social connections,  and stress reduction techniques were  discussed. C.  A full color page of  Calorie density of various food groups per pound showing examples of each food groups was provided to the patient.  - I have counseled him on diet and weight management by adopting a carbohydrate restricted/protein rich diet. Patient is encouraged to switch to unprocessed or minimally processed complex starch and increased protein intake (animal or plant source), fruits, and vegetables. -  he is advised to stick to a routine mealtimes to eat 3 meals a day and avoid unnecessary snacks (to snack only to correct hypoglycemia).   - he acknowledges that there is a room for improvement in his food and drink choices. - Suggestion is made for him to avoid simple carbohydrates from his diet including Cakes, Sweet Desserts, Ice Cream, Soda (diet and regular), Sweet Tea, Candies, Chips, Cookies, Store Bought Juices, Alcohol in Excess of 1-2 drinks a day, Artificial Sweeteners, Coffee Creamer, and Sugar-free Products. This will help patient to have more stable blood glucose profile and potentially avoid unintended weight gain.  -He has seen Santana Duke in the past for diabetes education, would like to re-establish with her again given his failing kidneys.  - I have approached him with the following individualized plan to manage his diabetes and patient agrees:    -he is encouraged to start/continue monitoring glucose 2 times daily, before breakfast and before bed, and to call the clinic if he has readings less than 70 or above 300 for 3 tests in a row.  - Adjustment parameters are given to him for hypo and hyperglycemia in writing.  - he is advised to continue Jardiance 25 mg po daily (currently doubling up on his 10 mg supply then will officially increase), therapeutically suitable for patient .  If he maintains such good control, we may try to de-escalate treatment in the future.  - he is not a candidate for Metformin due to concurrent renal insufficiency.  -  Specific targets for  A1c; LDL, HDL, and Triglycerides were discussed with the patient.  2) Blood Pressure /Hypertension:  his blood pressure is controlled to target.   he is advised to continue his current medications as prescribed by PCP/nephrology.  3) Lipids/Hyperlipidemia:    There is no recent lipid panel available to review.  he is advised to continue Crestor  10 mg daily at bedtime.  Side effects and precautions discussed with him.  4)  Weight/Diet:  his Body mass index is 30.35 kg/m.  -  clearly complicating his diabetes care.   he is a candidate for weight loss. I discussed with him the fact that loss of 5 - 10% of his  current body weight will have the most impact on his diabetes management.  Exercise, and detailed carbohydrates information provided  -  detailed on discharge instructions.  5) Chronic Care/Health Maintenance: -he is on ACEI/ARB and Statin medications and is encouraged to initiate and continue to follow up with Ophthalmology, Dentist, Podiatrist at least yearly or according to recommendations, and advised to stay away from smoking. I have recommended yearly flu vaccine and pneumonia vaccine at least every 5 years; moderate intensity exercise for up to 150 minutes weekly; and sleep for at least 7 hours a day.  - he is advised to maintain close follow up with Trivedi, Rajendra, MD for primary care needs, as well as his other providers for optimal and coordinated care.   -  Time spent in this patient care: 60 min, which was spent in counseling him about his diabetes and the rest reviewing his blood glucose logs, discussing his hypoglycemia and hyperglycemia episodes, reviewing his current and previous labs/studies (including abstraction from other facilities) and medications doses and developing a long term treatment plan based on the latest standards of care/guidelines; and documenting his care.    Please refer to Patient Instructions for Blood Glucose Monitoring and  Insulin /Medications Dosing Guide in media tab for additional information. Please also refer to Patient Self Inventory in the Media tab for reviewed elements of pertinent patient history.  Kyle KATHEE Margrette Mickey. participated in the discussions, expressed understanding, and voiced agreement with the above plans.  All questions were answered to his satisfaction. he is encouraged to contact clinic should he have any questions or concerns prior to his return visit.     Follow up plan: - Return in about 4 months (around 03/01/2024) for Diabetes F/U with A1c in office, No previsit labs, Bring meter and logs.    Benton Rio, [facility] [address] [place], [place] [address] Phone: [phone] Fax: [phone]  10/30/2023, 2:42 PM

## 2023-10-30 NOTE — Telephone Encounter (Signed)
 Was informed by REA that patient has been referred but he didn't  think he needed to schedule appointment. He notes he has a few questions about his kidneys.  TC to pt to answer questions. Referred him to [facility] website for further nutrition and diet related questions. Informed him there are lots of good videos on how the kidney functions, what foods are ok to have with CKD and how to make foods taste good without using salt. There is also information on their about potassium foods. He verbalized understanding. Informed him to call back if he had further questions.

## 2024-03-01 ENCOUNTER — Ambulatory Visit: Admitting: Nurse Practitioner

## 2024-03-01 ENCOUNTER — Encounter: Payer: Self-pay | Admitting: Nurse Practitioner

## 2024-03-01 VITALS — BP 116/64 | HR 60 | Ht 71.0 in | Wt 199.6 lb

## 2024-03-01 DIAGNOSIS — E1165 Type 2 diabetes mellitus with hyperglycemia: Secondary | ICD-10-CM

## 2024-03-01 DIAGNOSIS — E782 Mixed hyperlipidemia: Secondary | ICD-10-CM

## 2024-03-01 DIAGNOSIS — N184 Chronic kidney disease, stage 4 (severe): Secondary | ICD-10-CM

## 2024-03-01 DIAGNOSIS — E559 Vitamin D deficiency, unspecified: Secondary | ICD-10-CM

## 2024-03-01 DIAGNOSIS — Z7984 Long term (current) use of oral hypoglycemic drugs: Secondary | ICD-10-CM | POA: Diagnosis not present

## 2024-03-01 DIAGNOSIS — N1832 Chronic kidney disease, stage 3b: Secondary | ICD-10-CM

## 2024-03-01 DIAGNOSIS — I10 Essential (primary) hypertension: Secondary | ICD-10-CM

## 2024-03-01 LAB — POCT GLYCOSYLATED HEMOGLOBIN (HGB A1C): Hemoglobin A1C: 5.3 % (ref 4.0–5.6)

## 2024-03-01 NOTE — Progress Notes (Signed)
 Endocrinology Follow Up Note       03/01/2024, 3:06 PM   Subjective:    Patient ID: Kyle KATHEE Margrette Mickey., male    DOB: 07/27/1955.  Kyle KATHEE Margrette Mickey. is being seen in follow up after being seen in consultation for management of currently uncontrolled symptomatic diabetes requested by  Jesusa Caroline, MD.   Past Medical History:  Diagnosis Date   Chronic kidney disease, stage 3a (HCC)    Coronary artery disease    pt reports he has stents   Diabetes mellitus without complication (HCC)    Hyperlipidemia    Hypertension    Hypokalemia    PVD (peripheral vascular disease)    Stroke [facility])    Tricuspid regurgitation     Past Surgical History:  Procedure Laterality Date   BUBBLE STUDY  06/21/2020   Procedure: BUBBLE STUDY;  Surgeon: Alveta Aleene PARAS, MD;  Location: [facility] ENDOSCOPY;  Service: Cardiovascular;;   CARDIAC SURGERY     TEE WITHOUT CARDIOVERSION N/A 06/21/2020   Procedure: TRANSESOPHAGEAL ECHOCARDIOGRAM (TEE);  Surgeon: Alveta, Aleene PARAS, MD;  Location: [facility] ENDOSCOPY;  Service: Cardiovascular;  Laterality: N/A;    Social History   Socioeconomic History   Marital status: Divorced    Spouse name: Not on file   Number of children: Not on file   Years of education: Not on file   Highest education level: Not on file  Occupational History   Not on file  Tobacco Use   Smoking status: Former    Current packs/day: 0.00    Types: Cigarettes    Quit date: 11/24/2017    Years since quitting: 6.2   Smokeless tobacco: Never  Vaping Use   Vaping status: Never Used  Substance and Sexual Activity   Alcohol use: Not Currently   Drug use: Never   Sexual activity: Not on file  Other Topics Concern   Not on file  Social History Narrative   Not on file   Social Drivers of Health   Financial Resource Strain: Not on file  Food Insecurity: Not on file  Transportation Needs: Not on file  Physical  Activity: Not on file  Stress: Not on file  Social Connections: Not on file    Family History  Problem Relation Age of Onset   Alzheimer's disease Mother    Heart attack Father    Hypertension Brother    CAD Brother     Outpatient Encounter Medications as of 03/01/2024  Medication Sig   amLODipine  (NORVASC ) 10 MG tablet Take 1 tablet (10 mg total) by mouth daily.   aspirin  EC 81 MG EC tablet Take 1 tablet (81 mg total) by mouth daily. Swallow whole.   clopidogrel  (PLAVIX ) 75 MG tablet Take 75 mg by mouth daily.   cyanocobalamin (VITAMIN B12) 1000 MCG tablet Take 1,000 mcg by mouth daily.   ezetimibe  (ZETIA ) 10 MG tablet TAKE 1 TABLET BY MOUTH EVERY DAY   hydrALAZINE  (APRESOLINE ) 50 MG tablet Take 50 mg by mouth at bedtime.   JARDIANCE 25 MG TABS tablet Take 25 mg by mouth daily.   KERENDIA 10 MG TABS Take 1 tablet by mouth daily.   losartan  (COZAAR ) 100 MG  tablet Take 100 mg by mouth daily.   metoprolol  tartrate (LOPRESSOR ) 50 MG tablet Take 50 mg by mouth 2 (two) times daily.   omega-3 acid ethyl esters (LOVAZA ) 1 g capsule Take 1 capsule (1 g total) by mouth 2 (two) times daily.   rosuvastatin  (CRESTOR ) 10 MG tablet Take 10 mg by mouth daily.   VITAMIN D  PO Take 5,000 Units by mouth daily.   losartan -hydrochlorothiazide  (HYZAAR) 100-12.5 MG tablet Take 1 tablet by mouth daily. (Patient not taking: Reported on 03/01/2024)   rosuvastatin  (CRESTOR ) 10 MG tablet TAKE 1 TABLET BY MOUTH EVERY DAY (Patient not taking: Reported on 03/01/2024)   VITAMIN D  PO Take 1,000 Units by mouth daily. (Patient not taking: Reported on 03/01/2024)   [DISCONTINUED] glimepiride (AMARYL) 4 MG tablet Take 4 mg by mouth at bedtime.  (Patient not taking: Reported on 03/01/2024)   No facility-administered encounter medications on file as of 03/01/2024.    ALLERGIES: No Known Allergies  VACCINATION STATUS: Immunization History  Administered Date(s) Administered   PFIZER(Purple Top)SARS-COV-2  Vaccination 06/30/2019, 07/21/2019    Diabetes He presents for his follow-up diabetic visit. He has type 2 diabetes mellitus. Onset time: diagnosed at approx age of 67. His disease course has been improving. There are no hypoglycemic associated symptoms. There are no diabetic associated symptoms. There are no hypoglycemic complications. Symptoms are stable. Diabetic complications include a CVA, heart disease, nephropathy and PVD. Risk factors for coronary artery disease include diabetes mellitus, family history, male sex, obesity, hypertension and sedentary lifestyle. Current diabetic treatment includes oral agent (monotherapy). He is compliant with treatment most of the time. His weight is decreasing steadily. He is following a generally healthy diet. When asked about meal planning, he reported none. He has had a previous visit with a dietitian (Previously saw Kyle Marks for Diabetes education and nutrition education). He participates in exercise daily. His home blood glucose trend is decreasing steadily. (He presents today, accompanied by his wife, with his logs showing at target glycemic profile overall.  His POCT A1c today is 5.3%, essentially unchanged from last visit.  He denies any hypoglycemia.  He reports he is due for follow up with nephrology but has been unable to get in touch with them, wants a second opinion.  Wife has him eating much healthier!) An ACE inhibitor/angiotensin II receptor blocker is being taken. He does not see a podiatrist.Eye exam is current.     Review of systems  Constitutional: + decreasing body weight (intentional), current Body mass index is 27.84 kg/m., no fatigue, no subjective hyperthermia, no subjective hypothermia Eyes: no blurry vision, no xerophthalmia ENT: no sore throat, no nodules palpated in throat, no dysphagia/odynophagia, no hoarseness Cardiovascular: no chest pain, no shortness of breath, no palpitations, no leg swelling Respiratory: no cough, no shortness  of breath Gastrointestinal: no nausea/vomiting/diarrhea Musculoskeletal: no muscle/joint aches Skin: no rashes, no hyperemia Neurological: no tremors, no numbness, no tingling, no dizziness Psychiatric: no depression, no anxiety  Objective:     BP 116/64 (BP Location: Left Arm, Patient Position: Sitting, Cuff Size: Large)   Pulse 60   Ht 5' 11 (1.803 m)   Wt 199 lb 9.6 oz (90.5 kg)   BMI 27.84 kg/m   Wt Readings from Last 3 Encounters:  03/01/24 199 lb 9.6 oz (90.5 kg)  10/30/23 217 lb 9.6 oz (98.7 kg)  06/21/20 237 lb (107.5 kg)     BP Readings from Last 3 Encounters:  03/01/24 116/64  10/30/23 118/82  06/21/20 124/69  Physical Exam- Limited  Constitutional:  Body mass index is 27.84 kg/m. , not in acute distress, normal state of mind Eyes:  EOMI, no exophthalmos Musculoskeletal: no gross deformities, strength intact in all four extremities, no gross restriction of joint movements Skin:  no rashes, no hyperemia Neurological: no tremor with outstretched hands   Diabetic Foot Exam - Simple   Simple Foot Form Diabetic Foot exam was performed with the following findings: Yes 03/01/2024  2:21 PM  Visual Inspection No deformities, no ulcerations, no other skin breakdown bilaterally: Yes Sensation Testing Intact to touch and monofilament testing bilaterally: Yes Pulse Check Posterior Tibialis and Dorsalis pulse intact bilaterally: Yes Comments Corn to right lateral foot      CMP ( most recent) CMP     Component Value Date/Time   NA 139 06/02/2020 0449   K 3.4 (L) 06/02/2020 0449   CL 108 06/02/2020 0449   CO2 22 06/02/2020 0449   GLUCOSE 100 (H) 06/02/2020 0449   BUN 21 05/22/2023 0000   CREATININE 2.3 (A) 05/22/2023 0000   CREATININE 1.35 (H) 06/02/2020 0449   CREATININE 1.43 (H) 04/23/2019 1033   CALCIUM  8.8 (L) 06/02/2020 0449   PROT 6.6 06/01/2020 1706   ALBUMIN 3.9 06/01/2020 1706   AST 21 06/01/2020 1706   ALT 25 06/01/2020 1706   ALKPHOS 39  06/01/2020 1706   BILITOT 0.7 06/01/2020 1706   EGFR 25.0 10/19/2023 1009   EGFR 30 05/22/2023 0000   GFRNONAA 59 (L) 06/02/2020 0449     Diabetic Labs (most recent): Lab Results  Component Value Date   HGBA1C 5.3 03/01/2024   HGBA1C 9.9 05/22/2023   HGBA1C 6.6 (H) 06/02/2020     Lipid Panel ( most recent) Lipid Panel     Component Value Date/Time   CHOL 83 06/02/2020 0449   TRIG 118 06/02/2020 0449   HDL 29 (L) 06/02/2020 0449   CHOLHDL 2.9 06/02/2020 0449   VLDL 24 06/02/2020 0449   LDLCALC 30 06/02/2020 0449   LDLCALC 89 04/23/2019 1033      Lab Results  Component Value Date   TSH 3.23 10/19/2023   TSH 3.389 04/13/2020   FREET4 1.04 10/19/2023           Assessment & Plan:   1) Type 2 diabetes mellitus with hyperglycemia, without long-term current use of insulin  (HCC) (Primary)  He presents today, accompanied by his wife, with his logs showing at target glycemic profile overall.  His POCT A1c today is 5.3%, essentially unchanged from last visit.  He denies any hypoglycemia.  He reports he is due for follow up with nephrology but has been unable to get in touch with them, wants a second opinion.  Wife has him eating much healthier!  - Kyle Marks. has currently uncontrolled symptomatic type 2 DM since 68 years of age.   -Recent labs reviewed.  - I had a long discussion with him about the progressive nature of diabetes and the pathology behind its complications. -his diabetes is complicated by CAD, CVA, CKD stage 3b, PAD and he remains at a high risk for more acute and chronic complications which include CAD, CVA, CKD, retinopathy, and neuropathy. These are all discussed in detail with him.  The following Lifestyle Medicine recommendations according to American College of Lifestyle [employer] [facility]) were discussed and offered to patient and he agrees to start the journey:  A. Whole Foods, Plant-based plate comprising of fruits and vegetables, plant-based  proteins, whole-grain carbohydrates was discussed  in detail with the patient.   A list for source of those nutrients were also provided to the patient.  Patient will use only water or unsweetened tea for hydration. B.  The need to stay away from risky substances including alcohol, smoking; obtaining 7 to 9 hours of restorative sleep, at least 150 minutes of moderate intensity exercise weekly, the importance of healthy social connections,  and stress reduction techniques were discussed. C.  A full color page of  Calorie density of various food groups per pound showing examples of each food groups was provided to the patient.  - Nutritional counseling repeated/built upon at each appointment.  - The patient admits there is a room for improvement in their diet and drink choices. -  Suggestion is made for the patient to avoid simple carbohydrates from their diet including Cakes, Sweet Desserts / Pastries, Ice Cream, Soda (diet and regular), Sweet Tea, Candies, Chips, Cookies, Sweet Pastries, Store Bought Juices, Alcohol in Excess of 1-2 drinks a day, Artificial Sweeteners, Coffee Creamer, and Sugar-free Products. This will help patient to have stable blood glucose profile and potentially avoid unintended weight gain.   - I encouraged the patient to switch to unprocessed or minimally processed complex starch and increased protein intake (animal or plant source), fruits, and vegetables.   - Patient is advised to stick to a routine mealtimes to eat 3 meals a day and avoid unnecessary snacks (to snack only to correct hypoglycemia).  - I have approached him with the following individualized plan to manage his diabetes and patient agrees:    -he is encouraged to start/continue monitoring glucose 2 times daily, before breakfast and before bed, and to call the clinic if he has readings less than 70 or above 300 for 3 tests in a row.  - Adjustment parameters are given to him for hypo and hyperglycemia in  writing.  - he is advised to continue Jardiance 25 mg po daily.  He has done well on this dose and I am even considering backing off, however I am hesitant to do so as it is still likely providing some renal benefits.  I encouraged him to follow up with nephrology as far as wanting to reduce this in the future.  I did send in referral to Dr. Rachele as he is wanting a second opinion.  - he is not a candidate for Metformin due to concurrent renal insufficiency.  - Specific targets for  A1c; LDL, HDL, and Triglycerides were discussed with the patient.  2) Blood Pressure /Hypertension:  his blood pressure is controlled to target.   he is advised to continue his current medications as prescribed by PCP/nephrology.  3) Lipids/Hyperlipidemia:    There is no recent lipid panel available to review.  he is advised to continue Crestor  10 mg daily at bedtime.  Side effects and precautions discussed with him.  4)  Weight/Diet:  his Body mass index is 27.84 kg/m.  -  clearly complicating his diabetes care.   he is a candidate for weight loss. I discussed with him the fact that loss of 5 - 10% of his  current body weight will have the most impact on his diabetes management.  Exercise, and detailed carbohydrates information provided  -  detailed on discharge instructions.  5) Chronic Care/Health Maintenance: -he is on ACEI/ARB and Statin medications and is encouraged to initiate and continue to follow up with Ophthalmology, Dentist, Podiatrist at least yearly or according to recommendations, and advised to stay away  from smoking. I have recommended yearly flu vaccine and pneumonia vaccine at least every 5 years; moderate intensity exercise for up to 150 minutes weekly; and sleep for at least 7 hours a day.  - he is advised to maintain close follow up with Trivedi, Rajendra, MD for primary care needs, as well as his other providers for optimal and coordinated care.  I did send referral to podiatry today as he  has some painful corns that could use some treatment.     I spent  42  minutes in the care of the patient today including review of labs from CMP, Lipids, Thyroid  Function, Hematology (current and previous including abstractions from other facilities); face-to-face time discussing  his blood glucose readings/logs, discussing hypoglycemia and hyperglycemia episodes and symptoms, medications doses, his options of short and long term treatment based on the latest standards of care / guidelines;  discussion about incorporating lifestyle medicine;  and documenting the encounter. Risk reduction counseling performed per USPSTF guidelines to reduce obesity and cardiovascular risk factors.     Please refer to Patient Instructions for Blood Glucose Monitoring and Insulin /Medications Dosing Guide  in media tab for additional information. Please  also refer to  Patient Self Inventory in the Media  tab for reviewed elements of pertinent patient history.  Kyle KATHEE Margrette Mickey. participated in the discussions, expressed understanding, and voiced agreement with the above plans.  All questions were answered to his satisfaction. he is encouraged to contact clinic should he have any questions or concerns prior to his return visit.    Follow up plan: - Return in about 4 months (around 06/29/2024) for Diabetes F/U with A1c in office, No previsit labs.    Kyle Marks, [facility] [address] [place] [address] [place], [place] [address] Phone: [phone] Fax: [phone]  03/01/2024, 3:06 PM

## 2024-04-02 ENCOUNTER — Other Ambulatory Visit (HOSPITAL_COMMUNITY): Payer: Self-pay | Admitting: Nephrology

## 2024-04-02 DIAGNOSIS — R809 Proteinuria, unspecified: Secondary | ICD-10-CM

## 2024-04-02 DIAGNOSIS — E1122 Type 2 diabetes mellitus with diabetic chronic kidney disease: Secondary | ICD-10-CM

## 2024-04-02 DIAGNOSIS — N281 Cyst of kidney, acquired: Secondary | ICD-10-CM

## 2024-04-02 DIAGNOSIS — N184 Chronic kidney disease, stage 4 (severe): Secondary | ICD-10-CM

## 2024-04-22 ENCOUNTER — Ambulatory Visit (HOSPITAL_COMMUNITY)
Admission: RE | Admit: 2024-04-22 | Discharge: 2024-04-22 | Disposition: A | Discharge status: Home / Self Care | Source: Ambulatory Visit | Attending: Nephrology | Admitting: Nephrology

## 2024-04-22 DIAGNOSIS — R809 Proteinuria, unspecified: Secondary | ICD-10-CM | POA: Diagnosis present

## 2024-04-22 DIAGNOSIS — N281 Cyst of kidney, acquired: Secondary | ICD-10-CM | POA: Insufficient documentation

## 2024-04-22 DIAGNOSIS — E1122 Type 2 diabetes mellitus with diabetic chronic kidney disease: Secondary | ICD-10-CM | POA: Diagnosis present

## 2024-04-22 DIAGNOSIS — N184 Chronic kidney disease, stage 4 (severe): Secondary | ICD-10-CM | POA: Diagnosis present

## 2024-07-05 ENCOUNTER — Ambulatory Visit: Admitting: Nurse Practitioner
